# Patient Record
Sex: Male | Born: 1945 | Race: White | Hispanic: No | Marital: Married
Health system: Southern US, Community
[De-identification: ages and names within clinical notes are randomized; demographics above are authoritative.]

## PROBLEM LIST (undated history)

## (undated) DIAGNOSIS — I10 Essential (primary) hypertension: Secondary | ICD-10-CM

## (undated) HISTORY — PX: SPINAL CORD DECOMPRESSION: SHX97

---

## 1998-02-25 ENCOUNTER — Emergency Department (HOSPITAL_COMMUNITY): Admission: EM | Admit: 1998-02-25 | Discharge: 1998-02-25 | Payer: Self-pay | Admitting: Emergency Medicine

## 1998-04-08 ENCOUNTER — Emergency Department (HOSPITAL_COMMUNITY): Admission: EM | Admit: 1998-04-08 | Discharge: 1998-04-08 | Payer: Self-pay | Admitting: Emergency Medicine

## 1998-05-09 ENCOUNTER — Emergency Department (HOSPITAL_COMMUNITY): Admission: EM | Admit: 1998-05-09 | Discharge: 1998-05-09 | Payer: Self-pay | Admitting: Emergency Medicine

## 1998-07-05 ENCOUNTER — Encounter: Payer: Self-pay | Admitting: Neurological Surgery

## 1998-07-05 ENCOUNTER — Ambulatory Visit (HOSPITAL_COMMUNITY): Admission: RE | Admit: 1998-07-05 | Discharge: 1998-07-06 | Payer: Self-pay | Admitting: Neurological Surgery

## 1998-07-23 ENCOUNTER — Encounter: Payer: Self-pay | Admitting: Neurological Surgery

## 1998-07-23 ENCOUNTER — Inpatient Hospital Stay (HOSPITAL_COMMUNITY): Admission: RE | Admit: 1998-07-23 | Discharge: 1998-07-24 | Payer: Self-pay | Admitting: Neurological Surgery

## 2001-07-20 ENCOUNTER — Encounter: Payer: Self-pay | Admitting: Neurological Surgery

## 2001-07-20 ENCOUNTER — Encounter: Admission: RE | Admit: 2001-07-20 | Discharge: 2001-07-20 | Payer: Self-pay | Admitting: Neurological Surgery

## 2004-02-05 ENCOUNTER — Ambulatory Visit (HOSPITAL_COMMUNITY): Admission: RE | Admit: 2004-02-05 | Discharge: 2004-02-05 | Payer: Self-pay | Admitting: Neurological Surgery

## 2006-02-25 ENCOUNTER — Emergency Department (HOSPITAL_COMMUNITY): Admission: EM | Admit: 2006-02-25 | Discharge: 2006-02-26 | Payer: Self-pay | Admitting: Emergency Medicine

## 2006-05-14 ENCOUNTER — Emergency Department (HOSPITAL_COMMUNITY): Admission: EM | Admit: 2006-05-14 | Discharge: 2006-05-15 | Payer: Self-pay | Admitting: Emergency Medicine

## 2008-01-22 ENCOUNTER — Emergency Department (HOSPITAL_COMMUNITY): Admission: EM | Admit: 2008-01-22 | Discharge: 2008-01-23 | Payer: Self-pay | Admitting: Emergency Medicine

## 2011-08-03 LAB — CBC
Hemoglobin: 12.6 — ABNORMAL LOW
MCHC: 33.9
MCV: 88.2
Platelets: 81 — ABNORMAL LOW
RBC: 4.21 — ABNORMAL LOW
RDW: 15.1
WBC: 4.7

## 2011-08-03 LAB — URINALYSIS, ROUTINE W REFLEX MICROSCOPIC
Bilirubin Urine: NEGATIVE
Glucose, UA: NEGATIVE
Hgb urine dipstick: NEGATIVE
Urobilinogen, UA: 1
pH: 6.5

## 2011-08-03 LAB — BASIC METABOLIC PANEL
GFR calc non Af Amer: >60
Glucose, Bld: 105 — ABNORMAL HIGH

## 2012-08-07 DIAGNOSIS — R55 Syncope and collapse: Secondary | ICD-10-CM

## 2015-04-05 ENCOUNTER — Emergency Department (HOSPITAL_COMMUNITY): Payer: Medicare Other

## 2015-04-05 ENCOUNTER — Inpatient Hospital Stay (HOSPITAL_COMMUNITY)
Admission: EM | Admit: 2015-04-05 | Discharge: 2015-04-11 | DRG: 433 | Disposition: A | Discharge status: Home / Self Care | Payer: Medicare Other | Attending: Internal Medicine | Admitting: Internal Medicine

## 2015-04-05 ENCOUNTER — Encounter (HOSPITAL_COMMUNITY): Payer: Self-pay | Admitting: Emergency Medicine

## 2015-04-05 DIAGNOSIS — R601 Generalized edema: Secondary | ICD-10-CM | POA: Diagnosis not present

## 2015-04-05 DIAGNOSIS — D509 Iron deficiency anemia, unspecified: Secondary | ICD-10-CM | POA: Diagnosis present

## 2015-04-05 DIAGNOSIS — N179 Acute kidney failure, unspecified: Secondary | ICD-10-CM | POA: Diagnosis not present

## 2015-04-05 DIAGNOSIS — I619 Nontraumatic intracerebral hemorrhage, unspecified: Secondary | ICD-10-CM | POA: Diagnosis present

## 2015-04-05 DIAGNOSIS — Z880 Allergy status to penicillin: Secondary | ICD-10-CM

## 2015-04-05 DIAGNOSIS — I482 Chronic atrial fibrillation, unspecified: Secondary | ICD-10-CM | POA: Diagnosis present

## 2015-04-05 DIAGNOSIS — L0390 Cellulitis, unspecified: Secondary | ICD-10-CM | POA: Diagnosis present

## 2015-04-05 DIAGNOSIS — I890 Lymphedema, not elsewhere classified: Secondary | ICD-10-CM

## 2015-04-05 DIAGNOSIS — I10 Essential (primary) hypertension: Secondary | ICD-10-CM | POA: Diagnosis present

## 2015-04-05 DIAGNOSIS — I509 Heart failure, unspecified: Secondary | ICD-10-CM

## 2015-04-05 DIAGNOSIS — K7460 Unspecified cirrhosis of liver: Secondary | ICD-10-CM | POA: Diagnosis not present

## 2015-04-05 DIAGNOSIS — K7581 Nonalcoholic steatohepatitis (NASH): Secondary | ICD-10-CM | POA: Diagnosis present

## 2015-04-05 DIAGNOSIS — L03116 Cellulitis of left lower limb: Secondary | ICD-10-CM | POA: Diagnosis present

## 2015-04-05 DIAGNOSIS — E8770 Fluid overload, unspecified: Secondary | ICD-10-CM | POA: Diagnosis present

## 2015-04-05 DIAGNOSIS — R188 Other ascites: Secondary | ICD-10-CM | POA: Insufficient documentation

## 2015-04-05 DIAGNOSIS — T502X5A Adverse effect of carbonic-anhydrase inhibitors, benzothiadiazides and other diuretics, initial encounter: Secondary | ICD-10-CM | POA: Diagnosis not present

## 2015-04-05 DIAGNOSIS — I872 Venous insufficiency (chronic) (peripheral): Secondary | ICD-10-CM | POA: Diagnosis present

## 2015-04-05 DIAGNOSIS — N492 Inflammatory disorders of scrotum: Secondary | ICD-10-CM | POA: Diagnosis present

## 2015-04-05 DIAGNOSIS — N508 Other specified disorders of male genital organs: Secondary | ICD-10-CM | POA: Diagnosis present

## 2015-04-05 HISTORY — DX: Essential (primary) hypertension: I10

## 2015-04-05 LAB — CBC WITH DIFFERENTIAL/PLATELET
BASOS ABS: 0 10*3/uL (ref 0.0–0.1)
Basophils Relative: 1 % (ref 0–1)
Eosinophils Absolute: 0 10*3/uL (ref 0.0–0.7)
Eosinophils Relative: 1 % (ref 0–5)
HCT: 28.2 % — ABNORMAL LOW (ref 39.0–52.0)
HEMOGLOBIN: 8.5 g/dL — AB (ref 13.0–17.0)
Lymphocytes Relative: 4 % — ABNORMAL LOW (ref 12–46)
Lymphs Abs: 0.2 10*3/uL — ABNORMAL LOW (ref 0.7–4.0)
MCH: 23.5 pg — ABNORMAL LOW (ref 26.0–34.0)
MCHC: 30.1 g/dL (ref 30.0–36.0)
MCV: 77.9 fL — ABNORMAL LOW (ref 78.0–100.0)
MONO ABS: 0.3 10*3/uL (ref 0.1–1.0)
Monocytes Relative: 6 % (ref 3–12)
NEUTROS ABS: 4.6 10*3/uL (ref 1.7–7.7)
Neutrophils Relative %: 88 % — ABNORMAL HIGH (ref 43–77)
Platelets: 114 10*3/uL — ABNORMAL LOW (ref 150–400)
RBC: 3.62 MIL/uL — AB (ref 4.22–5.81)
RDW: 19.1 % — ABNORMAL HIGH (ref 11.5–15.5)
WBC: 5.2 10*3/uL (ref 4.0–10.5)

## 2015-04-05 LAB — I-STAT TROPONIN, ED: TROPONIN I, POC: 0.01 ng/mL (ref 0.00–0.08)

## 2015-04-05 LAB — COMPREHENSIVE METABOLIC PANEL
ALBUMIN: 2 g/dL — AB (ref 3.5–5.0)
ALT: 14 U/L — AB (ref 17–63)
ANION GAP: 8 (ref 5–15)
AST: 26 U/L (ref 15–41)
Alkaline Phosphatase: 96 U/L (ref 38–126)
BILIRUBIN TOTAL: 1.1 mg/dL (ref 0.3–1.2)
BUN: 19 mg/dL (ref 6–20)
CALCIUM: 7.7 mg/dL — AB (ref 8.9–10.3)
CO2: 27 mmol/L (ref 22–32)
Chloride: 102 mmol/L (ref 101–111)
Creatinine, Ser: 0.88 mg/dL (ref 0.61–1.24)
GFR calc non Af Amer: >60 mL/min (ref >60)
Glucose, Bld: 119 mg/dL — ABNORMAL HIGH (ref 65–99)
Potassium: 3.4 mmol/L — ABNORMAL LOW (ref 3.5–5.1)
Sodium: 137 mmol/L (ref 135–145)
TOTAL PROTEIN: 7 g/dL (ref 6.5–8.1)

## 2015-04-05 LAB — BRAIN NATRIURETIC PEPTIDE: B NATRIURETIC PEPTIDE 5: 189.9 pg/mL — AB (ref 0.0–100.0)

## 2015-04-05 MED ORDER — IOHEXOL 300 MG/ML  SOLN
100.0000 mL | Freq: Once | INTRAMUSCULAR | Status: AC | PRN
  Administered 2015-04-05: 100 mL via INTRAVENOUS

## 2015-04-05 MED ORDER — IOHEXOL 300 MG/ML  SOLN
25.0000 mL | Freq: Once | INTRAMUSCULAR | Status: AC | PRN
  Administered 2015-04-05: 25 mL via ORAL

## 2015-04-05 NOTE — ED Notes (Signed)
Patient is from Home states he is having Testicle swelling. Patient also have some swelling to the lower extermites with a rash noted. Patient alert and & O on arrival no pain on arrvial. Patient was given 10mg  of morphine by EMS.

## 2015-04-05 NOTE — ED Notes (Signed)
Patient has some reddnes noted to the LT inner thigh. Patient also has a rash to navel.

## 2015-04-05 NOTE — ED Provider Notes (Signed)
CSN: [id number]     Arrival date & time 04/05/15  2054 History   First MD Initiated Contact with Patient 04/05/15 2113     Chief Complaint  Patient presents with  . Groin Swelling     (Consider location/radiation/quality/duration/timing/severity/associated sxs/prior Treatment) HPI Comments: Patient is a 69 year old male with history of hypertension and multiple spinal surgeries. He presents today for evaluation of swelling and pain of his testicles and penis which has worsened over the past week. He is having difficulty urinating due to the discomfort. He denies any fevers or chills. He denies any injury or trauma. He reports he was seen at [facility] approximately one month ago. Had a CT scan performed which he was told was unremarkable and was discharged to home.  The history is provided by the patient.    Past Medical History  Diagnosis Date  . Hypertension    Past Surgical History  Procedure Laterality Date  . Spinal cord decompression     No family history on file. History  Substance Use Topics  . Smoking status: Passive Smoke Exposure - Never Smoker  . Smokeless tobacco: Not on file  . Alcohol Use: No    Review of Systems  All other systems reviewed and are negative.     Allergies  Penicillins  Home Medications   Prior to Admission medications   Not on File   BP 114/59 mmHg  Pulse 110  Temp(Src) 97.8 F (36.6 C) (Oral)  Resp 119  SpO2 98% Physical Exam  Constitutional: He is oriented to person, place, and time. No distress.  Patient is a 69 year old male who appears older than stated age. He appears chronically ill and is somewhat unkempt.  HENT:  Head: Normocephalic and atraumatic.  Neck: Normal range of motion. Neck supple.  Cardiovascular: Normal rate, regular rhythm and normal heart sounds.   No murmur heard. Pulmonary/Chest: Effort normal and breath sounds normal. No respiratory distress. He has no wheezes.  Abdominal: Soft. Bowel sounds are  normal. He exhibits distension. There is no tenderness. There is no rebound.  Genitourinary:  There is market swelling of the scrotum and penis. There is mild erythema but no significant warmth or crepitus.  Neurological: He is alert and oriented to person, place, and time.  Skin: Skin is warm and dry. He is not diaphoretic.  There is a erythematous area to the medial aspect of the left thigh that is warm to the touch. It measures approximately 8 x 15 cm.  Nursing note and vitals reviewed.   ED Course  Procedures (including critical care time) Labs Review Labs Reviewed  COMPREHENSIVE METABOLIC PANEL  CBC WITH DIFFERENTIAL/PLATELET  URINALYSIS, ROUTINE W REFLEX MICROSCOPIC (NOT AT [facility])    Imaging Review No results found.   EKG Interpretation   Date/Time:  Friday Apr 05 2015 21:14:12 EDT Ventricular Rate:  116 PR Interval:    QRS Duration: 90 QT Interval:  350 QTC Calculation: 486 R Axis:   -16 Text Interpretation:  Atrial fibrillation Borderline left axis deviation  Borderline repolarization abnormality Borderline prolonged QT interval  Confirmed by Kateryn Marasigan  MD, Denni France ([id number]) on 04/05/2015 11:24:26 PM      MDM   Final diagnoses:  None    Patient with history of afib, LE edema.  He presents with complaints of increased swelling, weakness, and shortness of breath which has worsened over the past 2 months, especially over the past week. He reports significant swelling of his penis and testicles and abdominal wall. His workup  reveals an elevated BNP and chest x-ray which reveals vascular congestion and cardiomegaly. A CT of the abdomen reveals soft tissue edema along the abdominal wall, ascites within the abdominal cavity, and edema of the scrotum but no gas.  He was given IV Lasix. As he is having difficulty ambulating and is having increased shortness of breath, I feel as though he will require admission. I've spoken with Dr. Onalee Huaavid from the hospitalist service who will  evaluate patient in the ER.    Geoffery Lyonsouglas Kalan Rinn, MD 04/06/15 [phone]

## 2015-04-05 NOTE — ED Notes (Signed)
Pt is aware urine is needed, pt is unable to urinate at this time. Urinal at bedside.  

## 2015-04-06 ENCOUNTER — Encounter (HOSPITAL_COMMUNITY): Payer: Self-pay | Admitting: *Deleted

## 2015-04-06 DIAGNOSIS — I482 Chronic atrial fibrillation, unspecified: Secondary | ICD-10-CM | POA: Diagnosis present

## 2015-04-06 DIAGNOSIS — T502X5A Adverse effect of carbonic-anhydrase inhibitors, benzothiadiazides and other diuretics, initial encounter: Secondary | ICD-10-CM | POA: Diagnosis not present

## 2015-04-06 DIAGNOSIS — I10 Essential (primary) hypertension: Secondary | ICD-10-CM | POA: Diagnosis present

## 2015-04-06 DIAGNOSIS — N508 Other specified disorders of male genital organs: Secondary | ICD-10-CM | POA: Diagnosis present

## 2015-04-06 DIAGNOSIS — R601 Generalized edema: Secondary | ICD-10-CM | POA: Diagnosis present

## 2015-04-06 DIAGNOSIS — I509 Heart failure, unspecified: Secondary | ICD-10-CM | POA: Diagnosis not present

## 2015-04-06 DIAGNOSIS — D509 Iron deficiency anemia, unspecified: Secondary | ICD-10-CM | POA: Diagnosis present

## 2015-04-06 DIAGNOSIS — I890 Lymphedema, not elsewhere classified: Secondary | ICD-10-CM

## 2015-04-06 DIAGNOSIS — Z880 Allergy status to penicillin: Secondary | ICD-10-CM | POA: Diagnosis not present

## 2015-04-06 DIAGNOSIS — N492 Inflammatory disorders of scrotum: Secondary | ICD-10-CM | POA: Diagnosis present

## 2015-04-06 DIAGNOSIS — K7581 Nonalcoholic steatohepatitis (NASH): Secondary | ICD-10-CM | POA: Diagnosis present

## 2015-04-06 DIAGNOSIS — R180 Malignant ascites: Secondary | ICD-10-CM | POA: Diagnosis not present

## 2015-04-06 DIAGNOSIS — L03116 Cellulitis of left lower limb: Secondary | ICD-10-CM | POA: Diagnosis present

## 2015-04-06 DIAGNOSIS — I872 Venous insufficiency (chronic) (peripheral): Secondary | ICD-10-CM | POA: Diagnosis present

## 2015-04-06 DIAGNOSIS — L0390 Cellulitis, unspecified: Secondary | ICD-10-CM | POA: Diagnosis not present

## 2015-04-06 DIAGNOSIS — L03119 Cellulitis of unspecified part of limb: Secondary | ICD-10-CM

## 2015-04-06 DIAGNOSIS — N179 Acute kidney failure, unspecified: Secondary | ICD-10-CM | POA: Diagnosis not present

## 2015-04-06 DIAGNOSIS — E8770 Fluid overload, unspecified: Secondary | ICD-10-CM | POA: Diagnosis present

## 2015-04-06 DIAGNOSIS — K7460 Unspecified cirrhosis of liver: Secondary | ICD-10-CM | POA: Diagnosis present

## 2015-04-06 DIAGNOSIS — R188 Other ascites: Secondary | ICD-10-CM | POA: Diagnosis present

## 2015-04-06 DIAGNOSIS — S06369S Traumatic hemorrhage of cerebrum, unspecified, with loss of consciousness of unspecified duration, sequela: Secondary | ICD-10-CM

## 2015-04-06 DIAGNOSIS — I619 Nontraumatic intracerebral hemorrhage, unspecified: Secondary | ICD-10-CM | POA: Diagnosis present

## 2015-04-06 LAB — URINALYSIS, ROUTINE W REFLEX MICROSCOPIC
GLUCOSE, UA: NEGATIVE mg/dL
HGB URINE DIPSTICK: NEGATIVE
Ketones, ur: NEGATIVE mg/dL
NITRITE: NEGATIVE
PROTEIN: NEGATIVE mg/dL
Specific Gravity, Urine: 1.037 — ABNORMAL HIGH (ref 1.005–1.030)
UROBILINOGEN UA: 1 mg/dL (ref 0.0–1.0)
pH: 5 (ref 5.0–8.0)

## 2015-04-06 LAB — RETICULOCYTES
RBC.: 3.9 MIL/uL — ABNORMAL LOW (ref 4.22–5.81)
RETIC COUNT ABSOLUTE: 74.1 10*3/uL (ref 19.0–186.0)
RETIC CT PCT: 1.9 % (ref 0.4–3.1)

## 2015-04-06 LAB — CBC
HEMATOCRIT: 30.3 % — AB (ref 39.0–52.0)
HEMOGLOBIN: 9.2 g/dL — AB (ref 13.0–17.0)
MCH: 23.6 pg — AB (ref 26.0–34.0)
MCHC: 30.4 g/dL (ref 30.0–36.0)
MCV: 77.7 fL — ABNORMAL LOW (ref 78.0–100.0)
Platelets: 122 10*3/uL — ABNORMAL LOW (ref 150–400)
RBC: 3.9 MIL/uL — ABNORMAL LOW (ref 4.22–5.81)
RDW: 19.4 % — ABNORMAL HIGH (ref 11.5–15.5)
WBC: 5.9 10*3/uL (ref 4.0–10.5)

## 2015-04-06 LAB — URINE MICROSCOPIC-ADD ON

## 2015-04-06 LAB — BASIC METABOLIC PANEL
Anion gap: 8 (ref 5–15)
BUN: 18 mg/dL (ref 6–20)
CALCIUM: 7.9 mg/dL — AB (ref 8.9–10.3)
CHLORIDE: 99 mmol/L — AB (ref 101–111)
CO2: 28 mmol/L (ref 22–32)
Creatinine, Ser: 0.85 mg/dL (ref 0.61–1.24)
GFR calc Af Amer: >60 mL/min (ref >60)
GLUCOSE: 113 mg/dL — AB (ref 65–99)
POTASSIUM: 3.2 mmol/L — AB (ref 3.5–5.1)
SODIUM: 135 mmol/L (ref 135–145)

## 2015-04-06 LAB — IRON AND TIBC
Iron: 34 ug/dL — ABNORMAL LOW (ref 45–182)
SATURATION RATIOS: 11 % — AB (ref 17.9–39.5)
TIBC: 312 ug/dL (ref 250–450)
UIBC: 278 ug/dL

## 2015-04-06 LAB — VITAMIN B12: VITAMIN B 12: 402 pg/mL (ref 180–914)

## 2015-04-06 LAB — TSH: TSH: 2.392 u[IU]/mL (ref 0.350–4.500)

## 2015-04-06 LAB — FOLATE: Folate: 8.5 ng/mL (ref >5.9)

## 2015-04-06 LAB — FERRITIN: FERRITIN: 52 ng/mL (ref 24–336)

## 2015-04-06 MED ORDER — ALPRAZOLAM 0.5 MG PO TABS
0.5000 mg | ORAL_TABLET | Freq: Two times a day (BID) | ORAL | Status: DC | PRN
  Administered 2015-04-09 – 2015-04-11 (×5): 0.5 mg via ORAL
  Filled 2015-04-06 (×5): qty 1

## 2015-04-06 MED ORDER — ONDANSETRON HCL 4 MG/2ML IJ SOLN: 4.0000 mg | Freq: Four times a day (QID) | INTRAMUSCULAR | Status: DC | PRN

## 2015-04-06 MED ORDER — DILTIAZEM HCL 25 MG/5ML IV SOLN
5.0000 mg | Freq: Once | INTRAVENOUS | Status: AC
  Administered 2015-04-06: 5 mg via INTRAVENOUS
  Filled 2015-04-06 (×2): qty 5

## 2015-04-06 MED ORDER — OXYCODONE-ACETAMINOPHEN 5-325 MG PO TABS
1.0000 | ORAL_TABLET | ORAL | Status: DC | PRN
  Administered 2015-04-06 – 2015-04-09 (×18): 1 via ORAL
  Filled 2015-04-06 (×18): qty 1

## 2015-04-06 MED ORDER — SPIRONOLACTONE 100 MG PO TABS
100.0000 mg | ORAL_TABLET | Freq: Every day | ORAL | Status: DC
  Administered 2015-04-06 – 2015-04-11 (×6): 100 mg via ORAL
  Filled 2015-04-06 (×6): qty 1

## 2015-04-06 MED ORDER — SODIUM CHLORIDE 0.9 % IV SOLN: 250.0000 mL | INTRAVENOUS | Status: DC | PRN

## 2015-04-06 MED ORDER — SODIUM CHLORIDE 0.9 % IJ SOLN: 3.0000 mL | INTRAMUSCULAR | Status: DC | PRN

## 2015-04-06 MED ORDER — FUROSEMIDE 40 MG PO TABS
40.0000 mg | ORAL_TABLET | Freq: Every day | ORAL | Status: DC
  Administered 2015-04-06 – 2015-04-07 (×2): 40 mg via ORAL
  Filled 2015-04-06 (×3): qty 1

## 2015-04-06 MED ORDER — VANCOMYCIN HCL 10 G IV SOLR
1250.0000 mg | Freq: Two times a day (BID) | INTRAVENOUS | Status: DC
  Administered 2015-04-06 – 2015-04-09 (×5): 1250 mg via INTRAVENOUS
  Filled 2015-04-06 (×5): qty 1250

## 2015-04-06 MED ORDER — FUROSEMIDE 10 MG/ML IJ SOLN: 40.0000 mg | Freq: Once | INTRAMUSCULAR | Status: DC

## 2015-04-06 MED ORDER — ENOXAPARIN SODIUM 40 MG/0.4ML ~~LOC~~ SOLN
40.0000 mg | Freq: Every day | SUBCUTANEOUS | Status: DC
  Administered 2015-04-06 – 2015-04-11 (×6): 40 mg via SUBCUTANEOUS
  Filled 2015-04-06 (×6): qty 0.4

## 2015-04-06 MED ORDER — PROPRANOLOL HCL ER 60 MG PO CP24
60.0000 mg | ORAL_CAPSULE | Freq: Every day | ORAL | Status: DC
  Administered 2015-04-06 – 2015-04-11 (×6): 60 mg via ORAL
  Filled 2015-04-06 (×7): qty 1

## 2015-04-06 MED ORDER — SODIUM CHLORIDE 0.9 % IJ SOLN
3.0000 mL | Freq: Two times a day (BID) | INTRAMUSCULAR | Status: DC
  Administered 2015-04-06 – 2015-04-11 (×9): 3 mL via INTRAVENOUS

## 2015-04-06 MED ORDER — POTASSIUM CHLORIDE 10 MEQ/100ML IV SOLN
10.0000 meq | INTRAVENOUS | Status: AC
  Administered 2015-04-06 (×2): 10 meq via INTRAVENOUS
  Filled 2015-04-06 (×2): qty 100

## 2015-04-06 MED ORDER — POTASSIUM CHLORIDE CRYS ER 20 MEQ PO TBCR
40.0000 meq | EXTENDED_RELEASE_TABLET | Freq: Every day | ORAL | Status: DC
  Administered 2015-04-06 – 2015-04-11 (×6): 40 meq via ORAL
  Filled 2015-04-06 (×6): qty 2

## 2015-04-06 MED ORDER — FUROSEMIDE 10 MG/ML IJ SOLN
40.0000 mg | Freq: Two times a day (BID) | INTRAMUSCULAR | Status: DC
  Administered 2015-04-06: 40 mg via INTRAVENOUS
  Filled 2015-04-06 (×3): qty 4

## 2015-04-06 MED ORDER — VANCOMYCIN HCL 10 G IV SOLR
2000.0000 mg | Freq: Once | INTRAVENOUS | Status: AC
  Administered 2015-04-06: 2000 mg via INTRAVENOUS
  Filled 2015-04-06: qty 2000

## 2015-04-06 MED ORDER — FUROSEMIDE 10 MG/ML IJ SOLN
80.0000 mg | Freq: Once | INTRAMUSCULAR | Status: AC
  Administered 2015-04-06: 80 mg via INTRAVENOUS
  Filled 2015-04-06: qty 8

## 2015-04-06 MED ORDER — SODIUM CHLORIDE 0.9 % IJ SOLN
3.0000 mL | Freq: Two times a day (BID) | INTRAMUSCULAR | Status: DC
  Administered 2015-04-06 – 2015-04-10 (×6): 3 mL via INTRAVENOUS

## 2015-04-06 NOTE — H&P (Signed)
PCP:   No primary care provider on file.   Chief Complaint:  swelling  HPI: 69 yo male with unclear history gets all of his care at [facility] comes in with his sons for not getting better and not happy with [facility] hospital.  Pt was hospitalized at [facility] approx 2 weeks ago and it sounds like he has cirrhosis of the liver and chronic lymphedema to his legs which is severe.  Comes in today because the swelling is worse, his scrotum is swollen along with his pannus now, but his legs are better.  He denies heart failure.  However, the patient and the sons really dont know much about what is going on and have a lot of questions about his [facility] stay.  We do not have records from [facility] yet.  Pt does know that he has been told he has cirrhosis, no etoh history.  Has not been evaluated by GI doctor.  Denies fevers.  Is always sob especially with exertion.  Is very weak.  His son is changing his leg dressings three times a week, and his sons say that his legs look much better than they did 2 weeks ago (of note, his legs still look horrible).  He has some new redness to his left inner upper thigh which is new.  He does not use oxygen at home.  He says he takes a fluid pill at home.  He does not know what his home meds are.  We are asked to admit patient for anasarca, gross fluid overload and cellulitis of his left inner thigh.  History obtained from patient and two sons.  Of note, after multiple times asking patient whether he was offered rehab or snf placement from [facility] (pt obviously would meet criteria for rehab) it finally came out that they did offer him this but he refused so he was sent home.  Review of Systems:  Positive and negative as per HPI otherwise all other systems are negative  Past Medical History: Past Medical History  Diagnosis Date  . Hypertension    Past Surgical History  Procedure Laterality Date  . Spinal cord decompression      Medications: Prior to Admission  medications   Medication Sig Start Date End Date Taking? Authorizing Provider  acetaminophen (TYLENOL) 500 MG tablet Take 1,000 mg by mouth every 6 (six) hours as needed for mild pain.   Yes Historical Provider, MD  ALPRAZolam Prudy Feeler) 0.5 MG tablet Take 0.5 mg by mouth 2 (two) times daily as needed for anxiety.   Yes Historical Provider, MD  oxyCODONE-acetaminophen (PERCOCET/ROXICET) 5-325 MG per tablet Take 1 tablet by mouth every 4 (four) hours as needed for severe pain.   Yes Historical Provider, MD    Allergies:   Allergies  Allergen Reactions  . Penicillins Rash    Social History:  reports that he has been passively smoking.  He does not have any smokeless tobacco history on file. He reports that he does not drink alcohol or use illicit drugs.  Family History: Neg for cirrhosis  Physical Exam: Filed Vitals:   04/05/15 2230 04/06/15 0030 04/06/15 0045 04/06/15 0115  BP: 130/69 126/75 140/53 122/66  Pulse: 102 107 104 121  Temp:      TempSrc:      Resp:      SpO2: 98% 98% 96% 94%   General appearance: alert, cooperative and no distress Head: Normocephalic, without obvious abnormality, atraumatic Eyes: negative Nose: Nares normal. Septum midline. Mucosa normal. No drainage  or sinus tenderness. Neck: no JVD and supple, symmetrical, trachea midline Lungs: clear to auscultation bilaterally Heart: regular rate and rhythm, S1, S2 normal, no murmur, click, rub or gallop Abdomen: soft, non-tender; bowel sounds normal; no masses,  no organomegaly  Pannus with edema Extremities: edema 4+ edema up to scrotum, scrotum swollen Pulses: 2+ and symmetric Skin: severe lymphedema to ble with skin breakdown throughout.  Cellulitis to inner upper left thigh warm to touch Neurologic: Grossly normal    Labs on Admission:   Recent Labs  04/05/15 2224  NA 137  K 3.4*  CL 102  CO2 27  GLUCOSE 119*  BUN 19  CREATININE 0.88  CALCIUM 7.7*    Recent Labs  04/05/15 2224  AST 26   ALT 14*  ALKPHOS 96  BILITOT 1.1  PROT 7.0  ALBUMIN 2.0*     Recent Labs  04/05/15 2224  WBC 5.2  NEUTROABS 4.6  HGB 8.5*  HCT 28.2*  MCV 77.9*  PLT 114*   Radiological Exams on Admission: Dg Chest 2 View  04/05/2015   CLINICAL DATA:  Lower extremity and groin swelling, acute onset. Initial encounter.  EXAM: CHEST  2 VIEW  COMPARISON:  Chest radiograph performed 03/05/2015  FINDINGS: The lungs are well-aerated. Vascular congestion is noted, with mildly increased interstitial markings, concerning for mild interstitial edema. A small right pleural effusion is noted. No pneumothorax is seen.  The heart is enlarged.  No acute osseous abnormalities are seen.  IMPRESSION: Vascular congestion and cardiomegaly, with mildly increased interstitial markings, concerning for mild interstitial edema. Small right pleural effusion noted.   Electronically Signed   By: Roanna RaiderJeffery  Chang M.D.   On: 04/05/2015 23:06   Ct Abdomen Pelvis W Contrast  04/06/2015   CLINICAL DATA:  Status post fall 3 months ago. Scrotal swelling and lower extremity swelling, chronic in nature. Initial encounter.  EXAM: CT ABDOMEN AND PELVIS WITH CONTRAST  TECHNIQUE: Multidetector CT imaging of the abdomen and pelvis was performed using the standard protocol following bolus administration of intravenous contrast.  CONTRAST:  100mL OMNIPAQUE IOHEXOL 300 MG/ML  SOLN  COMPARISON:  CT of the abdomen and pelvis from 07/05/2014, and abdominal ultrasound performed 03/07/2015  FINDINGS: The visualized lung bases are clear. Scattered coronary artery calcifications are seen.  Moderate ascites is noted within the abdomen and pelvis, tracking into a small to moderate umbilical hernia. Diffuse soft tissue edema is noted along the lateral abdominal wall and pannus, likely reflecting mild anasarca.  Diffuse nodularity of the hepatic contour is compatible with hepatic cirrhosis. The spleen is enlarged, measuring 20.6 cm in length. Stones are seen  dependently within the gallbladder. The gallbladder is otherwise unremarkable. The pancreas and adrenal glands are unremarkable.  A 4 mm nonobstructing stone is noted at the interpole region of the right kidney. The kidneys are otherwise unremarkable. There is no evidence of hydronephrosis. No obstructing ureteral stones are seen. No significant perinephric stranding is appreciated.  No free fluid is identified. The small bowel is unremarkable in appearance. The stomach is within normal limits. No acute vascular abnormalities are seen. Mild calcification is seen along the abdominal aorta and its branches.  The appendix is normal in caliber and contains air, without evidence of appendicitis. Mild diverticulosis is noted along the sigmoid colon, without evidence of diverticulitis.  The bladder is mildly distended and grossly unremarkable. The prostate is not well characterized. A small to moderate right inguinal hernia is seen, partially filled with fluid. There is diffuse soft  tissue edema at the level of the scrotum. No underlying soft tissue air is seen. No inguinal lymphadenopathy is seen.  No acute osseous abnormalities are identified. Anterior flowing osteophytes are seen along the thoracic and lumbar spine, with fusion along the posterior elements, likely reflecting ankylosing spondylitis. There is also fusion at the sacroiliac joints.  IMPRESSION: 1. Diffuse scrotal wall edema noted. Small to moderate right inguinal hernia is partially filled with fluid. No evidence of herniation of bowel. 2. Moderate ascites within the abdomen and pelvis, tracking into a small to moderate umbilical hernia. 3. Diffuse soft tissue edema along the lateral abdominal wall and pannus likely reflects mild anasarca. 4. Findings of hepatic cirrhosis.  Splenomegaly noted. 5. Cholelithiasis; gallbladder otherwise unremarkable. 6. Scattered coronary artery calcification noted. 7. Mild calcification along the abdominal aorta and its  branches. 8. Mild diverticulosis along the sigmoid colon, without evidence of diverticulitis. 9. Changes of ankylosing spondylitis noted.   Electronically Signed   By: Roanna Raider M.D.   On: 04/06/2015 00:30   ekg and cxr reviewed by myself  Assessment/Plan  69 yo male with anasarca with report of cirrhosis of the liver  Principal Problem:   Cirrhosis of liver with ascites- decompensated. Received lasix  iv once in ed will cont at  iv q 12 hours and see how he diuresis with that.   Unknown what he takes at home.  Med rec pending pharmacy review.  Check coags.  o2 sats are good, vss.  Pt appears grossly fluid overloaded and with advanced liver disease.  Obtain records from [facility] to see what work up was done 2 weeks ago before repeating it all now.  Active Problems:   Anasarca-  Noted, as above   Cellulitis-  Place on iv vancomycin for his cellulitis to inner upper left thigh   Lymphedema in setting of cirrhosis - per family his legs are much improved.  Elevate legs.  Obtain wound care in am.  Unable to place pictures of legs in chart myself, consider doing this tomorrow.   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  Admit to tele bed.  FULL CODE.  Eban Weick A 04/06/2015, 2:18 AM

## 2015-04-06 NOTE — Evaluation (Signed)
Physical Therapy Evaluation Patient Details Name: Isaac Ashley MRN: [id number] DOB: 12/14/1945 Today's Date: 04/06/2015   History of Present Illness  Patient is a 69 yo male admitted 04/05/15 with swollen scrotum and LE's.  Patient with gross fluid overload and chronic lymphedema (severe) per chart.  PMH: HTN, chronic lymphedema, back surgery, Afib, ICH from fall - surgery  Clinical Impression  Patient presents with problems listed below.  Will benefit from acute PT to maximize functional mobility prior to discharge.  Recommend SNF at discharge for continued therapy.    Follow Up Recommendations SNF;Supervision/Assistance - 24 hour    Equipment Recommendations  None recommended by PT    Recommendations for Other Services       Precautions / Restrictions Precautions Precautions: Fall Precaution Comments: Patient has fallen x3 recently per patient Restrictions Weight Bearing Restrictions: No      Mobility  Bed Mobility Overal bed mobility: Needs Assistance Bed Mobility: Rolling;Supine to Sit;Sit to Supine Rolling: Min guard   Supine to sit: Min assist;HOB elevated Sit to supine: Min assist;HOB elevated   General bed mobility comments: Verbal cues for technique.  Required increased time for all mobility.  Use of bedrail to roll to both sides.  Assist to raise trunk to sitting position.  Once upright, patient with good sitting balance.  Patient sat EOB x 8 minutes - difficult due to scrotal edema.  Assist to bring LE's onto bed to return to supine.  Transfers                 General transfer comment: NT due to pain  Ambulation/Gait                Stairs            Wheelchair Mobility    Modified Rankin (Stroke Patients Only)       Balance Overall balance assessment: Needs assistance Sitting-balance support: No upper extremity supported;Feet supported Sitting balance-Leahy Scale: Fair                                        Pertinent Vitals/Pain Pain Assessment: 0-10 Pain Score: 9  Pain Location: Back, scrotum, LE's Pain Descriptors / Indicators: Aching;Sore;Throbbing Pain Intervention(s): Limited activity within patient's tolerance;Repositioned    Home Living Family/patient expects to be discharged to:: Private residence Living Arrangements: Spouse/significant other;Children (Daughter) Available Help at Discharge: Family;Available 24 hours/day (Son's also assist) Type of Home: House Home Access: Ramped entrance     Home Layout: One level Home Equipment: Walker - 2 wheels;Cane - single point;Bedside commode;Shower seat;Hospital bed;Wheelchair - manual      Prior Function Level of Independence: Needs assistance   Gait / Transfers Assistance Needed: Wife and children assist patient with ambulation with walker  ADL's / Homemaking Assistance Needed: Assist for bathing, dressing, meal prep.  Son's change LE dressings        Hand Dominance        Extremity/Trunk Assessment   Upper Extremity Assessment: Generalized weakness           Lower Extremity Assessment: RLE deficits/detail;LLE deficits/detail RLE Deficits / Details: Strength grossly 3/5.  Lower leg bandaged.  Significant edema LLE Deficits / Details: Strength grossly 3/5.  Lower leg bandaged.  Significant edema in upper and lower leg     Communication   Communication: Expressive difficulties (Difficult to understand at times)  Cognition Arousal/Alertness: Awake/alert Behavior During Therapy:  Flat affect Overall Cognitive Status: No family/caregiver present to determine baseline cognitive functioning                      General Comments General comments (skin integrity, edema, etc.): Poor skin integrity BLE's distally.  Edema BLE's    Exercises        Assessment/Plan    PT Assessment Patient needs continued PT services  PT Diagnosis Difficulty walking;Generalized weakness;Acute pain   PT Problem List  Decreased strength;Decreased activity tolerance;Decreased balance;Decreased mobility;Decreased knowledge of use of DME;Obesity;Decreased skin integrity;Pain  PT Treatment Interventions DME instruction;Gait training;Functional mobility training;Therapeutic activities;Balance training;Patient/family education   PT Goals (Current goals can be found in the Care Plan section) Acute Rehab PT Goals Patient Stated Goal: To go home PT Goal Formulation: With patient Time For Goal Achievement: 04/13/15 Potential to Achieve Goals: Fair    Frequency Min 3X/week   Barriers to discharge        Co-evaluation               End of Session   Activity Tolerance: Patient limited by pain;Patient limited by fatigue Patient left: in bed;with call bell/phone within reach           Time: 1919-1933 PT Time Calculation (min) (ACUTE ONLY): 14 min   Charges:   PT Evaluation $Initial PT Evaluation Tier I: 1 Procedure     PT G CodesVena Ashley:        Isaac Ashley, PT, [facility] Acute Rehab Services Pager [phone]

## 2015-04-06 NOTE — Progress Notes (Signed)
ANTIBIOTIC CONSULT NOTE - INITIAL  Pharmacy Consult for Vancomycin Indication: cellulitis  Allergies  Allergen Reactions  . Penicillins Rash    Patient Measurements: Height: 6\' 1"  (185.4 cm) Weight: 285 lb (129.275 kg) (pt stated weight) IBW/kg (Calculated) : 79.9 Adjusted Body Weight:   Vital Signs: Temp: 97.8 F (36.6 C) (05/27 2110) Temp Source: Oral (05/27 2110) BP: 122/66 mmHg (05/28 0115) Pulse Rate: 121 (05/28 0115) Intake/Output from previous day:   Intake/Output from this shift:    Labs:  Recent Labs  04/05/15 2224  WBC 5.2  HGB 8.5*  PLT 114*  CREATININE 0.88   CrCl cannot be calculated (Unknown ideal weight.). No results for input(s): VANCOTROUGH, VANCOPEAK, VANCORANDOM, GENTTROUGH, GENTPEAK, GENTRANDOM, TOBRATROUGH, TOBRAPEAK, TOBRARND, AMIKACINPEAK, AMIKACINTROU, AMIKACIN in the last 72 hours.   Microbiology: No results found for this or any previous visit (from the past 720 hour(s)).  Medical History: Past Medical History  Diagnosis Date  . Hypertension     Medications:  Prescriptions prior to admission  Medication Sig Dispense Refill Last Dose  . acetaminophen (TYLENOL) 500 MG tablet Take 1,000 mg by mouth every 6 (six) hours as needed for mild pain.   04/04/2015 at Unknown time  . ALPRAZolam (XANAX) 0.5 MG tablet Take 0.5 mg by mouth 2 (two) times daily as needed for anxiety.   04/04/2015 at Unknown time  . oxyCODONE-acetaminophen (PERCOCET/ROXICET) 5-325 MG per tablet Take 1 tablet by mouth every 4 (four) hours as needed for severe pain.   Past Month at Unknown time   Scheduled:  . enoxaparin (LOVENOX) injection  40 mg Subcutaneous Daily  . furosemide  40 mg Intravenous BID  . sodium chloride  3 mL Intravenous Q12H  . sodium chloride  3 mL Intravenous Q12H   Infusions:    Assessment: 69yo male with history of Afib and LE edema presents with increased swelling of groin, weakness, and SOB. Pharmacy is consulted to dose vancomycin for  cellulitis. Pt is afebrile, WBC 5.2, sCr 0.88.  Goal of Therapy:  Vancomycin trough level 10-15 mcg/ml  Plan:  Vancomycin 2g IV load followed by 1250mg  q12h Measure antibiotic drug levels at steady state Follow up culture results, renal function, and clinical course  Arlean HoppingCorey M. Newman PiesBall, PharmD Clinical Pharmacist Pager [phone] 04/06/2015,3:04 AM

## 2015-04-06 NOTE — Progress Notes (Signed)
Utilization review completed.  

## 2015-04-06 NOTE — Progress Notes (Signed)
4:11 PM I agree with HPI/GPe and A/P per Dr. Onalee Huaavid  69 y/o ? recent [facility] 4/28--?[facility] Acute LE cellulitis.  During that Hospital stay, he developed Afib c RVR and was started on lopressor BID.  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  He has had multiple back surgeries-I did not ask him sexual orientation or h/o sexual contact in prison He is unsure if he has ever had hepatitis  He was Rx there initially Vancomycin and Zosyn and sent home on Bactrim In either event-He was noticed to be swollen over the past 2-3 days and [facility] He was supposed to follow up post-Hospital stay with Dr. Olena [place]anaj [place]c 5/1      Grossly ascitic ++ shift dukll Erythema thigh +HJ reflex   Patient Active Problem List   Diagnosis Date Noted  . Anasarca 04/06/2015  . Cirrhosis of liver with ascites 04/06/2015  . Cellulitis 04/06/2015  . Lymphedema 04/06/2015  . Microcytic anemia 04/06/2015  . Chronic a-fib 04/06/2015  . 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 Congestive heart disease     Start aldactone and Lasix-[facility] + splenomegally Start propanolol 60 daily-For Varicosity bleed prevention etc etc NEeds OP Gi input NO need CIWA currently Cont Vanc 1200 cc fluid restrict Aldactone: Lasix 100:40 po LVP ordered-no need Check SBP--already abx on board Cannot reliably use coumadin for Afib despite ? [place] score-PLT 122 Replace Kdur 40 daily

## 2015-04-06 NOTE — ED Notes (Addendum)
This RN and Gabe, RN removed covering from legs on pt with Admitting present.  Dressings took approximately 20 minutes to remove.  Family has been assisting pt at home with dressing.  Towels and bandages and tape all removed.   Severe swelling, moisture and skin breakdown noted.  Skin is yellow, flaking, weepy.

## 2015-04-06 NOTE — ED Notes (Signed)
Report received from RN Janee on pt's swelling, skin condition and general assessment.

## 2015-04-07 ENCOUNTER — Inpatient Hospital Stay (HOSPITAL_COMMUNITY): Payer: Medicare Other

## 2015-04-07 LAB — CBC WITH DIFFERENTIAL/PLATELET
Basophils Absolute: 0.1 10*3/uL (ref 0.0–0.1)
Basophils Relative: 1 % (ref 0–1)
EOS ABS: 0.2 10*3/uL (ref 0.0–0.7)
Eosinophils Relative: 5 % (ref 0–5)
HCT: 29.3 % — ABNORMAL LOW (ref 39.0–52.0)
Hemoglobin: 8.9 g/dL — ABNORMAL LOW (ref 13.0–17.0)
Lymphocytes Relative: 17 % (ref 12–46)
Lymphs Abs: 0.7 10*3/uL (ref 0.7–4.0)
MCH: 24.2 pg — AB (ref 26.0–34.0)
MCHC: 30.4 g/dL (ref 30.0–36.0)
MCV: 79.6 fL (ref 78.0–100.0)
Monocytes Absolute: 0.8 10*3/uL (ref 0.1–1.0)
Monocytes Relative: 19 % — ABNORMAL HIGH (ref 3–12)
NEUTROS ABS: 2.4 10*3/uL (ref 1.7–7.7)
Neutrophils Relative %: 58 % (ref 43–77)
Platelets: 128 10*3/uL — ABNORMAL LOW (ref 150–400)
RBC: 3.68 MIL/uL — ABNORMAL LOW (ref 4.22–5.81)
RDW: 19.4 % — AB (ref 11.5–15.5)
WBC: 4 10*3/uL (ref 4.0–10.5)

## 2015-04-07 LAB — COMPREHENSIVE METABOLIC PANEL
ALT: 14 U/L — AB (ref 17–63)
AST: 28 U/L (ref 15–41)
Albumin: 1.9 g/dL — ABNORMAL LOW (ref 3.5–5.0)
Alkaline Phosphatase: 75 U/L (ref 38–126)
Anion gap: 4 — ABNORMAL LOW (ref 5–15)
BILIRUBIN TOTAL: 0.8 mg/dL (ref 0.3–1.2)
BUN: 26 mg/dL — ABNORMAL HIGH (ref 6–20)
CHLORIDE: 100 mmol/L — AB (ref 101–111)
CO2: 30 mmol/L (ref 22–32)
Calcium: 7.8 mg/dL — ABNORMAL LOW (ref 8.9–10.3)
Creatinine, Ser: 1.07 mg/dL (ref 0.61–1.24)
GFR calc Af Amer: >60 mL/min (ref >60)
GFR calc non Af Amer: >60 mL/min (ref >60)
Glucose, Bld: 135 mg/dL — ABNORMAL HIGH (ref 65–99)
POTASSIUM: 3.8 mmol/L (ref 3.5–5.1)
Sodium: 134 mmol/L — ABNORMAL LOW (ref 135–145)
TOTAL PROTEIN: 6.5 g/dL (ref 6.5–8.1)

## 2015-04-07 LAB — MAGNESIUM: Magnesium: 1.9 mg/dL (ref 1.7–2.4)

## 2015-04-07 LAB — URINE CULTURE

## 2015-04-07 LAB — PROTIME-INR
INR: 1.53 — ABNORMAL HIGH (ref 0.00–1.49)
Prothrombin Time: 18.5 seconds — ABNORMAL HIGH (ref 11.6–15.2)

## 2015-04-07 MED ORDER — LIDOCAINE HCL (PF) 1 % IJ SOLN
INTRAMUSCULAR | Status: AC
  Filled 2015-04-07: qty 10

## 2015-04-07 MED ORDER — ALBUMIN HUMAN 25 % IV SOLN
50.0000 g | Freq: Once | INTRAVENOUS | Status: AC
  Administered 2015-04-07: 50 g via INTRAVENOUS
  Filled 2015-04-07: qty 200

## 2015-04-07 NOTE — Progress Notes (Signed)
Isaac Ashley:[id number] DOB: 01/12/1946 DOA: 04/05/2015 PCP: [name], MD  Brief narrative:  69 y/o ? recent admission 4/28--?03/10/15 to [facility] c Acute LE cellulitis. During that Hospital stay, he developed Afib c RVR and was started on lopressor BID. He has a chronic Afib h/o and was previously controlled on Cardizem He has chronic lymphedema per report ~7-8 yrs duration. 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 He has had multiple back surgeries-I did not ask him sexual orientation or h/o sexual contact in prison He is unsure if he has ever had hepatitis He was Rx there initially Vancomycin and Zosyn and sent home on Bactrim. In either event-He was noticed to be swollen over the past 2-3 days and came to [facility] ed for evalutaion He was supposed to follow up post-Hospital stay with Dr. [name] in [place] on d/c  5/1  Past medical history-As per Problem list Chart reviewed as below- Reviewed and reviewed [facility] notes  Consultants:  Wound nurse   Procedures:Measurement: Numerous open areas on bilateral LEs, on the left, the two largest measure Mediall: 4.5cm x 3.5cm and Anterior: 3cm x 5cm. The depth of both is obscured by the presence of adherent yellow slough. On the right, the two largest measure Distal: 4cm x 3cm and Proximal 3cm x 4cm.  The depth of both is obscured by the presence of yellow slough. Wound bed:See above. Drainage (amount, consistency, odor) LEs have been weeping, albeit this is less since LEs have been elevated and patient is in bed. Periwound: Bilateral LEs with chronic changes due to moisture, numerous elevations, and depressions.  Toenails are long and hygiene is poor. Dressing procedure/placement/frequency: I will provide orders for topical conservative care and request Ortho Tech to come and place Unna's Boots bilaterally.  They will add to their schedule for twice weekly  changes while here.  Patient will require either [facility] or confirmation that the patient can get to the [facility] care center for maintenance. If you agree, Please order.   Antibiotics:  Iv vanc on admit 5/27   Subjective   Better Feels less swollen No cp no f chills sob nor other issues currently   Objective    Interim History:   Telemetry: fib   Objective: Filed Vitals:   04/07/15 0434 04/07/15 1125 04/07/15 1141 04/07/15 1338  BP: 109/64 114/71 116/66 108/61  Pulse: 73   85  Temp: 98.3 F (36.8 C)   97.2 F (36.2 C)  TempSrc: Oral   Oral  Resp: 18   18  Height:      Weight: 134.854 kg (297 lb 4.8 oz)     SpO2: 98%   95%    Intake/Output Summary (Last 24 hours) at 04/07/15 1616 Last data filed at 04/07/15 1339  Gross per 24 hour  Intake   1280 ml  Output    675 ml  Net    605 ml    Exam:  General: eomi, ncat Cardiovascular: s1 s2 no m/r/g Respiratory: clear no added soudn Abdomen:  Obese-less firm Skin wounds briefly examined-see above Neuro intact-no tremor nor asterixis  Data Reviewed: Basic Metabolic Panel:  Recent Labs Lab 04/05/15 2224 04/06/15 0346 04/07/15 0548 04/07/15 1230  NA 137 135 134*  --   K 3.4* 3.2* 3.8  --   CL 102 99* 100*  --   CO2 --   GLUCOSE 119* 113* 135*  --   BUN 19  18 26*  --   CREATININE 0.88 0.85 1.07  --   CALCIUM 7.7* 7.9* 7.8*  --   MG  --   --   --  1.9   Liver Function Tests:  Recent Labs Lab 04/05/15 2224 04/07/15 0548  AST 26 28  ALT 14* 14*  ALKPHOS 96 75  BILITOT 1.1 0.8  PROT 7.0 6.5  ALBUMIN 2.0* 1.9*   No results for input(s): LIPASE, AMYLASE in the last 168 hours. No results for input(s): AMMONIA in the last 168 hours. CBC:  Recent Labs Lab 04/05/15 2224 04/06/15 0346 04/07/15 0548  WBC 5.2 5.9 4.0  NEUTROABS 4.6  --  2.4  HGB 8.5* 9.2* 8.9*  HCT 28.2* 30.3* 29.3*  MCV 77.9* 77.7* 79.6  PLT 114* 122* 128*   Cardiac Enzymes: No results for  input(s): CKTOTAL, CKMB, CKMBINDEX, TROPONINI in the last 168 hours. BNP: Invalid input(s): POCBNP CBG: No results for input(s): GLUCAP in the last 168 hours.  Recent Results (from the past 240 hour(s))  Urine culture     Status: None   Collection Time: 04/06/15  6:38 AM  Result Value Ref Range Status   Specimen Description URINE, CLEAN CATCH  Final   Special Requests NONE  Final   Colony Count   Final    7,000 COLONIES/ML Performed at [facility]    Culture   Final    INSIGNIFICANT GROWTH Performed at [facility]    Report Status 04/07/2015 FINAL  Final     Studies:              All Imaging reviewed and is as per above notation   Scheduled Meds: . albumin human  50 g Intravenous Once  . enoxaparin (LOVENOX) injection  40 mg Subcutaneous Daily  . furosemide  40 mg Oral Daily  . lidocaine (PF)      . potassium chloride  40 mEq Oral Daily  . propranolol ER  60 mg Oral Daily  . sodium chloride  3 mL Intravenous Q12H  . sodium chloride  3 mL Intravenous Q12H  . spironolactone  100 mg Oral Daily  . vancomycin  1,250 mg Intravenous Q12H   Continuous Infusions:    Assessment/Plan:  1. Decompensated cirrhosis, MELD=15-not heavy drinker now [facility][heavy drinker 8-9 yr ago-1/5th a night on a Friday], will order Hepatitis panel [tells me might have been exposed to hepatitis as a younger man-multiple prison tattoos as well], HIV, etc etc-unclear inciting event-poor understanding of diet-Nutritionist will need to see.  LVP done 5/29 s/p 2 liters ascites drained.  Replaced with IV albumin 50 mg x 1.  On Aldactone:lasix 100:40 now. -2.07 liters. Low salt diet.   2. Cellulitis scrotum/legs and wounds-appreciate WOC nurse-continue vancomycin IV for now. 3. AKI-2/2 to diuretics-will monitor volume and creatinine am.  Will adjust diuresis as prn 4. Rate controlled chr Afib-Chadscore>-poor candidate for Coumadin given cirrhosis and current 5. Iron def anemia-hemoccult.  Likely  NASH.  US at [facility]=Cirrhosis + splenomegaly-has never had colonoscopy?-need screening varices endo as OP  Code Status: full Family Communication:  20 min discussion c family Disposition Plan: inpatient-monitor   Pleas KochJai Milessa Hogan, MD  [facility] Pager [phone] 04/07/2015, 4:16 PM    LOS: 1 day

## 2015-04-07 NOTE — Consult Note (Signed)
WOC wound consult note Reason for Consult:Bilateral LEs with chronic venous insufficiency and full thickness wounds.  Patient has been treated for the past 8 years at [facility] [place]. [facility], but that they had to stop coming because they "used up all their visits" and his "legs didn't get better". Wound type:Venous insufficiency with skin changes and open, full thickness wounds. Pressure Ulcer POA: No Measurement: Numerous open areas on bilateral LEs, on the left, the two largest measure Mediall: 4.5cm x 3.5cm and Anterior: 3cm x 5cm. The depth of both is obscured by the presence of adherent yellow slough. On the right, the two largest measure Distal: 4cm x 3cm and Proximal 3cm x 4cm.  The depth of both is obscured by the presence of yellow slough. Wound bed:See above. Drainage (amount, consistency, odor) LEs have been weeping, albeit this is less since LEs have been elevated and patient is in bed. Periwound: Bilateral LEs with chronic changes due to moisture, numerous elevations, and depressions.  Toenails are long and hygiene is poor. Dressing procedure/placement/frequency: I will provide orders for topical conservative care and request Ortho Tech to come and place Unna's Boots bilaterally.  They will add to their schedule for twice weekly changes while here.  [facility] or [facility] that the [facility] for maintenance. If you agree, Please order. WOC nursing team will not follow, but will remain available to this patient, the nursing and medical teams.  Please re-consult if needed. Thanks, Ladona MowLaurie Timoth Schara, MSN, RN, GNP, [place]WOCN, CWON-AP [phone])

## 2015-04-07 NOTE — Clinical Social Work Note (Signed)
Clinical Social Work Assessment  Patient Details  Name: Isaac Ashley MRN: [id number] Date of Birth: [birth year]  Date of referral:  04/07/15               Reason for consult:  Facility Placement, Discharge Planning                Permission sought to share information with:  Other (No permission granted.) Permission granted to share information::  No  Name::        Agency::     Relationship::     Contact Information:     Housing/Transportation Living arrangements for the past 2 months:  Single Family Home Source of Information:  Patient Patient Interpreter Needed:  None Criminal Activity/Legal Involvement Pertinent to Current Situation/Hospitalization:  No - Comment as needed Significant Relationships:  Adult Children, Spouse Lives with:  Adult Children, Spouse Do you feel safe going back to the place where you live?  Yes Need for family participation in patient care:  No (Coment)  Care giving concerns:  Patient expressed no concerns at this time.   Social Worker assessment / plan:  CSW received referral for possible SNF placement at time of discharge. CSW spoke with patient regarding discharge disposition. Patient understanding of PT recommendation for SNF placement at time of discharge, however, patient currently refusing SNF placement. Per patient, patient will return home with patient'Ashley wife and patient'Ashley son once medically stable. Patient reports patient has support at home. CSW signing off.  Employment status:  Retired Health and safety inspectornsurance information:  Medicare PT Recommendations:  Skilled Nursing Facility Information / Referral to community resources:  Other (Comment Required) (Patient refusing SNF placement.)  Patient/Family'Ashley Response to care:  Patient understanding of PT recommendation and currently refusing SNF placement.  Patient/Family'Ashley Understanding of and Emotional Response to Diagnosis, Current Treatment, and Prognosis:  Patient understanding of PT recommendation and  currently refusing SNF placement.  Emotional Assessment Appearance:  Appears stated age Attitude/Demeanor/Rapport:  Other (Pleasant.) Affect (typically observed):  Appropriate, Accepting, Calm, Pleasant Orientation:  Oriented to Self, Oriented to Place, Oriented to  Time, Oriented to Situation Alcohol / Substance use:  Not Applicable Psych involvement (Current and /or in the community):  No (Comment) (Not appropriate on this admission.)  Discharge Needs  Concerns to be addressed:  No discharge needs identified Readmission within the last 30 days:  No Current discharge risk:  None Barriers to Discharge:  No Barriers Identified   Rod MaeVaughn, Isaac Kitchings S, LCSW 04/07/2015, 2:08 PM [phone]

## 2015-04-07 NOTE — Procedures (Signed)
Successful US guided paracentesis from RUQ.  Yielded 2.1 liters of clear light yellow fluid.  No immediate complications.  Pt tolerated well.   Specimen was not sent for labs.  Pattricia BossMORGAN, Dazha Kempa D PA-C 04/07/2015 12:06 PM

## 2015-04-07 NOTE — Progress Notes (Signed)
Orthopedic Tech Progress Note Patient Details:  Isaac FolksGrover C Ashley 02/22/1946 [id number]  Ortho Devices Type of Ortho Device: Roland RackUnna boot Ortho Device/Splint Location: (B) LE Ortho Device/Splint Interventions: Ordered, Application   Jennye MoccasinHughes, Jakyle Petrucelli Craig 04/07/2015, 4:52 PM

## 2015-04-08 LAB — COMPREHENSIVE METABOLIC PANEL
ALT: 13 U/L — ABNORMAL LOW (ref 17–63)
ANION GAP: 5 (ref 5–15)
AST: 25 U/L (ref 15–41)
Albumin: 2.4 g/dL — ABNORMAL LOW (ref 3.5–5.0)
Alkaline Phosphatase: 67 U/L (ref 38–126)
BUN: 28 mg/dL — AB (ref 6–20)
CALCIUM: 7.8 mg/dL — AB (ref 8.9–10.3)
CO2: 28 mmol/L (ref 22–32)
Chloride: 101 mmol/L (ref 101–111)
Creatinine, Ser: 1.13 mg/dL (ref 0.61–1.24)
GLUCOSE: 117 mg/dL — AB (ref 65–99)
Potassium: 4 mmol/L (ref 3.5–5.1)
SODIUM: 134 mmol/L — AB (ref 135–145)
TOTAL PROTEIN: 6.8 g/dL (ref 6.5–8.1)
Total Bilirubin: 0.9 mg/dL (ref 0.3–1.2)

## 2015-04-08 LAB — OCCULT BLOOD X 1 CARD TO LAB, STOOL: Fecal Occult Bld: NEGATIVE

## 2015-04-08 LAB — CBC WITH DIFFERENTIAL/PLATELET
BASOS ABS: 0 10*3/uL (ref 0.0–0.1)
BASOS PCT: 1 % (ref 0–1)
Eosinophils Absolute: 0.3 10*3/uL (ref 0.0–0.7)
Eosinophils Relative: 7 % — ABNORMAL HIGH (ref 0–5)
HEMATOCRIT: 26.8 % — AB (ref 39.0–52.0)
HEMOGLOBIN: 8.3 g/dL — AB (ref 13.0–17.0)
LYMPHS ABS: 0.7 10*3/uL (ref 0.7–4.0)
Lymphocytes Relative: 16 % (ref 12–46)
MCH: 23.9 pg — ABNORMAL LOW (ref 26.0–34.0)
MCHC: 31 g/dL (ref 30.0–36.0)
MCV: 77.2 fL — ABNORMAL LOW (ref 78.0–100.0)
MONO ABS: 0.7 10*3/uL (ref 0.1–1.0)
MONOS PCT: 15 % — AB (ref 3–12)
Neutro Abs: 2.8 10*3/uL (ref 1.7–7.7)
Neutrophils Relative %: 62 % (ref 43–77)
Platelets: 105 10*3/uL — ABNORMAL LOW (ref 150–400)
RBC: 3.47 MIL/uL — ABNORMAL LOW (ref 4.22–5.81)
RDW: 19.2 % — AB (ref 11.5–15.5)
WBC: 4.4 10*3/uL (ref 4.0–10.5)

## 2015-04-08 LAB — PROTIME-INR
INR: 1.48 (ref 0.00–1.49)
PROTHROMBIN TIME: 18 s — AB (ref 11.6–15.2)

## 2015-04-08 NOTE — Plan of Care (Signed)
Problem: Food- and Nutrition-Related Knowledge Deficit (NB-1.1) Goal: Nutrition education Formal process to instruct or train a patient/client in a skill or to impart knowledge to help patients/clients voluntarily manage or modify food choices and eating behavior to maintain or improve health. Outcome: Adequate for Discharge Received consult for diet education. Provided pt with handouts "Cirrhosis Nutrition Therapy" and "Low Sodium Nutrition Therapy" by the Academy of Nutrition and Dietetics.  Pt states he does not salt his food and eats mostly at home (wife cooks low sodium).  Diet recall shows high intakes of several high sodium foods, including pickles and canned soups. Provided pt with list of foods to avoid. Walked pt through handouts explaining the importance of reading nutrition labels to help reduce salt in the diet. Emphasized importance of eating a balanced diet and consuming several small meals and snacks through the day. Provided tips to reduce fluid intake and ways to alleviate dry mouth.  Suggested considering a multivitamin supplement.  Encouraged pt to consume high quality protein and stay active whenever possible.  Pt verbalized understanding, all pt's questions were answered, teach-back method used, expect good compliance.   Tilton Marsalis A. [facility] Dietetic Intern Pager: [phone] 04/08/2015 11:01 AM

## 2015-04-08 NOTE — Progress Notes (Signed)
Isaac Ashley ZOX:[id number] DOB: Jul 10, 1946 DOA: 04/05/2015 PCP: [name], MD  Brief narrative:  69 y/o ? recent admission 4/28--?03/10/15 to [facility] c Acute LE cellulitis. During that Hospital stay, he developed Afib c RVR and was started on lopressor BID. He has a chronic Afib h/o and was previously controlled on Cardizem He has chronic lymphedema per report ~7-8 yrs duration. 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 He has had multiple back surgeries-I did not ask him sexual orientation or h/o sexual contact in prison He is unsure if he has ever had hepatitis He was Rx there initially Vancomycin and Zosyn and sent home on Bactrim. In either event-He was noticed to be swollen over the past 2-3 days and came to [facility] ed for evalutaion He was supposed to follow up post-Hospital stay with Dr. [name] in [place] on d/c 5/1  Past medical history-As per Problem list Chart reviewed as below- Reviewed and reviewed [facility] notes  Consultants:  Wound nurse   Procedures:Measurement: Numerous open areas on bilateral LEs, on the left, the two largest measure Mediall: 4.5cm x 3.5cm and Anterior: 3cm x 5cm. The depth of both is obscured by the presence of adherent yellow slough. On the right, the two largest measure Distal: 4cm x 3cm and Proximal 3cm x 4cm.  The depth of both is obscured by the presence of yellow slough. Wound bed:See above. Drainage (amount, consistency, odor) LEs have been weeping, albeit this is less since LEs have been elevated and patient is in bed. Periwound: Bilateral LEs with chronic changes due to moisture, numerous elevations, and depressions.  Toenails are long and hygiene is poor. Dressing procedure/placement/frequency: I will provide orders for topical conservative care and request Ortho Tech to come and place Unna's Boots bilaterally.  They will add to their schedule for twice weekly  changes while here.  Patient will require either [facility] or confirmation that the patient can get to the [facility] W[facility] care center for maintenance. If you agree, Please order.   Antibiotics:  Iv vanc on admit 5/27   Subjective   Better Feels less swollen No cp no f chills sob nor other issues currently   Objective    Interim History:   Telemetry: fib   Objective: Filed Vitals:   04/07/15 1338 04/07/15 2245 04/08/15 0200 04/08/15 0519  BP: 108/61 106/57  107/70  Pulse: 85 87  79  Temp: 97.2 F (36.2 C) 98.1 F (36.7 C)  98.2 F (36.8 C)  TempSrc: Oral Oral  Oral  Resp: Height:      Weight:    136.261 kg (300 lb 6.4 oz)  SpO2: 95% 95% 94% 96%    Intake/Output Summary (Last 24 hours) at 04/08/15 1111 Last data filed at 04/08/15 0819  Gross per 24 hour  Intake    720 ml  Output    875 ml  Net   -155 ml    Exam:  General: eomi, ncat Cardiovascular: s1 s2 no m/r/g Respiratory: clear no added soudn Abdomen:  Obese-less firm Skin wounds briefly examined-  right lower extremity     Left lower extremity Neuro intact-no tremor nor asterixis  Data Reviewed: Basic Metabolic Panel:  Recent Labs Lab 04/05/15 2224 04/06/15 0346 04/07/15 0548 04/07/15 1230 04/08/15 0521  NA 137 135 134*  --  134*  K 3.4* 3.2* 3.8  --  4.0  CL 102 99* 100*  --  101  CO2  27 28 30   --  28  GLUCOSE 119* 113* 135*  --  117*  BUN 19 18 26*  --  28*  CREATININE 0.88 0.85 1.07  --  1.13  CALCIUM 7.7* 7.9* 7.8*  --  7.8*  MG  --   --   --  1.9  --    Liver Function Tests:  Recent Labs Lab 04/05/15 2224 04/07/15 0548 04/08/15 0521  AST 26 28 25   ALT 14* 14* 13*  ALKPHOS 96 75 67  BILITOT 1.1 0.8 0.9  PROT 7.0 6.5 6.8  ALBUMIN 2.0* 1.9* 2.4*   No results for input(s): LIPASE, AMYLASE in the last 168 hours. No results for input(s): AMMONIA in the last 168 hours. CBC:  Recent Labs Lab 04/05/15 2224 04/06/15 0346 04/07/15 0548  04/08/15 0521  WBC 5.2 5.9 4.0 4.4  NEUTROABS 4.6  --  2.4 2.8  HGB 8.5* 9.2* 8.9* 8.3*  HCT 28.2* 30.3* 29.3* 26.8*  MCV 77.9* 77.7* 79.6 77.2*  PLT 114* 122* 128* 105*   Cardiac Enzymes: No results for input(s): CKTOTAL, CKMB, CKMBINDEX, TROPONINI in the last 168 hours. BNP: Invalid input(s): POCBNP CBG: No results for input(s): GLUCAP in the last 168 hours.  Recent Results (from the past 240 hour(s))  Urine culture     Status: None   Collection Time: 04/06/15  6:38 AM  Result Value Ref Range Status   Specimen Description URINE, CLEAN CATCH  Final   Special Requests NONE  Final   Colony Count   Final    7,000 COLONIES/ML Performed at [facility]    Culture   Final    INSIGNIFICANT GROWTH Performed at [facility]    Report Status 04/07/2015 FINAL  Final     Studies:              All Imaging reviewed and is as per above notation   Scheduled Meds: . enoxaparin (LOVENOX) injection  40 mg Subcutaneous Daily  . potassium chloride  40 mEq Oral Daily  . propranolol ER  60 mg Oral Daily  . sodium chloride  3 mL Intravenous Q12H  . sodium chloride  3 mL Intravenous Q12H  . spironolactone  100 mg Oral Daily  . vancomycin  1,250 mg Intravenous Q12H   Continuous Infusions:    Assessment/Plan:  1. 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  LVP done 5/29 s/p 2 liters ascites drained.  Replaced with IV albumin 50 mg x 1.  Was on Aldactone:lasix 100:40, but given mild ? creatinine, hold Lasix 5/30. weights and fluid status did not appear accurate--he will need continued diuresis as an outpatient and I will refer [facility] for further follow-up 2. Cellulitis scrotum/legs and wounds-appreciate WOC nurse-continue vancomycin  IV for now--given no fever or chills X3 days on IV vancomycin, by mouth doxycycline twice a day to complete 14 days purulent cellulitis coverage.  [facility] l[facility]hedema management and may be physical therapy for wrapping of his lower extremities-pictures taken 5/30 as above 3. AKI-2/2 to diuretics-will monitor volume and creatinine am. see above discussion 4. Rate controlled chr Afib-Chadscore>-poor candidate for Coumadin given cirrhosis and currentplatelet count in the low 100 range 5. microcytic anemia Iron def anemia-hemoccult performed 5/30, contribution of liver  disease plus cirrhosis 6. 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  [place] at [facility]=Cirrhosis + splenomegaly-has never had colonoscopy?-need screening varices endo as OP.  Code Status: full Family Communication:  20 min discussion c family Disposition Plan: therapy is recommended skilled nursing coverage-I will speak to social worker regarding this   Pleas Koch, MD  [facility] Pager [phone] 04/08/2015, 11:11 AM    LOS: 2 days

## 2015-04-08 NOTE — Progress Notes (Deleted)
Placed patient on BiPAP due to increased RR. Setting 10/5 and 40%  Rate is decreasing  RT will continue to monitor 

## 2015-04-08 NOTE — Progress Notes (Signed)
ANTIBIOTIC CONSULT NOTE  Pharmacy Consult for Vancomycin Indication: cellulitis  Allergies  Allergen Reactions  . Codeine Nausea Only  . Hydrocodone Nausea Only  . Penicillins Rash    Patient Measurements: Height: 6\' 1"  (185.4 cm) Weight: (!) 300 lb 6.4 oz (136.261 kg) IBW/kg (Calculated) : 79.9   Vital Signs: Temp: 98.2 F (36.8 C) (05/30 0519) Temp Source: Oral (05/30 0519) BP: 107/70 mmHg (05/30 0519) Pulse Rate: 79 (05/30 0519) Intake/Output from previous day: 05/29 0701 - 05/30 0700 In: 1220 [P.O.:720; IV Piggyback:500] Out: 825 [Urine:825] Intake/Output from this shift: Total I/O In: 360 [P.O.:360] Out: 375 [Urine:375]  Labs:  Recent Labs  04/06/15 0346 04/07/15 0548 04/08/15 0521  WBC 5.9 4.0 4.4  HGB 9.2* 8.9* 8.3*  PLT 122* 128* 105*  CREATININE 0.85 1.07 1.13   Estimated Creatinine Clearance: 90.7 mL/min (by C-G formula based on Cr of 1.13). No results for input(s): VANCOTROUGH, VANCOPEAK, VANCORANDOM, GENTTROUGH, GENTPEAK, GENTRANDOM, TOBRATROUGH, TOBRAPEAK, TOBRARND, AMIKACINPEAK, AMIKACINTROU, AMIKACIN in the last 72 hours.   Microbiology: Recent Results (from the past 720 hour(s))  Urine culture     Status: None   Collection Time: 04/06/15  6:38 AM  Result Value Ref Range Status   Specimen Description URINE, CLEAN CATCH  Final   Special Requests NONE  Final   Colony Count   Final    7,000 COLONIES/ML Performed at [facility]    Culture   Final    INSIGNIFICANT GROWTH Performed at [facility]    Report Status 04/07/2015 FINAL  Final    Medical History: Past Medical History  Diagnosis Date  . Hypertension    Assessment: 69yo male with history of Afib and LE edema presents with increased swelling of groin, weakness, and SOB. Pharmacy consulted to dose vancomycin for cellulitis.   Pt is currently afebrile, WBC 4.4, sCr up slightly to 1.1.  5/28 urine - ng  Goal of Therapy:  Vancomycin trough level 10-15  mcg/ml  Plan:  Continue vancomycin 1250mg  q12h Measure antibiotic drug levels at steady state Follow up culture results, renal function, and clinical course Likely change to po abx soon  Sheppard CoilFrank Wilson PharmD., BCPS Clinical Pharmacist Pager [phone] 04/08/2015 1:43 PM

## 2015-04-08 NOTE — Progress Notes (Signed)
CSW order again received to assist patient with SNF placement.  Patient has been assessed over the weekend by Marcelline DeistEmily Vaughn, LCSWA on 04/07/15.  Patient declined SNF option and plans to return home with his wife and family. CSW services has signed off.  Plan home with home health when medically stable.  CSW signing off again but available if needed.  Thanks!  Lorri Frederickonna T. Jaci LazierCrowder, KentuckyLCSW [phone]

## 2015-04-08 NOTE — Progress Notes (Signed)
PT Cancellation Note  Patient Details Name: Isaac Ashley MRN: [id number] DOB: [birth year]   Cancelled Treatment:    Reason Eval/Treat Not Completed: Medical issues which prohibited therapy (waiting for UNNA wraps on legs. )  Noted orfor lymphadema  Wrapping and instruction. Spoke with PT supervisor who states that LE lymphadema techniques not indicated in this patient without ability to return to outpatient specialty clinic. Also, patient has recommendations for Unna boots per WOC written 04/07/15. The amount of drainage wound not be indicated for lymphadema wrapping. Patient is not interested in SNF at this time.   Rada HayHill, Isaac Ashley 04/08/2015, 2:11 PM Blanchard KelchKaren Valdez Brannan PT [phone]

## 2015-04-08 NOTE — Progress Notes (Signed)
Orthopedic Tech Progress Note Patient Details:  Isaac FolksGrover C Ashley 1946-02-27 [id number] Xeroform applied to Bil. LEs followed by unna boots as order requested. Patient tolerated application well.  Ortho Devices Type of Ortho Device: Ace wrap, Unna boot Ortho Device/Splint Location: Bilateral Ortho Device/Splint Interventions: Application   Asia R Thompson 04/08/2015, 2:55 PM

## 2015-04-09 LAB — COMPREHENSIVE METABOLIC PANEL
ALK PHOS: 72 U/L (ref 38–126)
ALT: 13 U/L — ABNORMAL LOW (ref 17–63)
AST: 24 U/L (ref 15–41)
Albumin: 2.4 g/dL — ABNORMAL LOW (ref 3.5–5.0)
Anion gap: 8 (ref 5–15)
BILIRUBIN TOTAL: 1 mg/dL (ref 0.3–1.2)
BUN: 27 mg/dL — AB (ref 6–20)
CHLORIDE: 102 mmol/L (ref 101–111)
CO2: 25 mmol/L (ref 22–32)
Calcium: 7.9 mg/dL — ABNORMAL LOW (ref 8.9–10.3)
Creatinine, Ser: 1.01 mg/dL (ref 0.61–1.24)
GLUCOSE: 114 mg/dL — AB (ref 65–99)
POTASSIUM: 4.5 mmol/L (ref 3.5–5.1)
Sodium: 135 mmol/L (ref 135–145)
Total Protein: 6.7 g/dL (ref 6.5–8.1)

## 2015-04-09 LAB — HEPATITIS PANEL, ACUTE
HCV AB: 0.2 {s_co_ratio} — AB (ref 0.0–0.9)
HEP A IGM: NEGATIVE — AB
HEP B C IGM: NEGATIVE — AB
HEP B S AG: NEGATIVE — AB

## 2015-04-09 LAB — CBC
HCT: 27.5 % — ABNORMAL LOW (ref 39.0–52.0)
Hemoglobin: 8.4 g/dL — ABNORMAL LOW (ref 13.0–17.0)
MCH: 23.6 pg — ABNORMAL LOW (ref 26.0–34.0)
MCHC: 30.5 g/dL (ref 30.0–36.0)
MCV: 77.2 fL — ABNORMAL LOW (ref 78.0–100.0)
PLATELETS: 113 10*3/uL — AB (ref 150–400)
RBC: 3.56 MIL/uL — AB (ref 4.22–5.81)
RDW: 19.3 % — AB (ref 11.5–15.5)
WBC: 4.7 10*3/uL (ref 4.0–10.5)

## 2015-04-09 LAB — HIV ANTIBODY (ROUTINE TESTING W REFLEX): HIV SCREEN 4TH GENERATION: NONREACTIVE

## 2015-04-09 MED ORDER — FUROSEMIDE 40 MG PO TABS
40.0000 mg | ORAL_TABLET | Freq: Every day | ORAL | Status: DC
  Administered 2015-04-09 – 2015-04-11 (×3): 40 mg via ORAL
  Filled 2015-04-09 (×3): qty 1

## 2015-04-09 MED ORDER — OXYCODONE HCL 5 MG PO TABS
5.0000 mg | ORAL_TABLET | Freq: Four times a day (QID) | ORAL | Status: DC | PRN
  Administered 2015-04-09 – 2015-04-10 (×3): 5 mg via ORAL
  Filled 2015-04-09 (×4): qty 1

## 2015-04-09 MED ORDER — DOXYCYCLINE HYCLATE 100 MG PO TABS
100.0000 mg | ORAL_TABLET | Freq: Two times a day (BID) | ORAL | Status: DC
  Administered 2015-04-09 – 2015-04-11 (×5): 100 mg via ORAL
  Filled 2015-04-09 (×6): qty 1

## 2015-04-09 NOTE — Progress Notes (Addendum)
Physical Therapy Treatment Patient Details Name: Isaac FolksGrover C Sowle MRN: [id number] DOB: May 17, 1946 Today's Date: 04/09/2015    History of Present Illness Patient is a 69 yo male admitted 04/05/15 with swollen scrotum and LE's.  Patient with gross fluid overload and chronic lymphedema (severe) per chart.  PMH: HTN, chronic lymphedema, back surgery, Afib, ICH from fall - surgery    PT Comments    Patient tolerated  Sitting at the edge of the bed. Patient reports that he is able to [facility] to [facility] with  Assist of 1 and declines to stand at the present. Patient indicates that he plans to go home. He states that  The legs feel better with the unna wraps.   Patient may require  Nonemergency transport  When discharged.  Follow Up Recommendations  SNF;Supervision/Assistance - 24 hour (patient declines snf, has  Hopital bed, WC at home)     Equipment Recommendations  None recommended by PT    Recommendations for Other Services       Precautions / Restrictions Precautions Precautions: Fall Precaution Comments: , has weeping LE's, unna boots    Mobility  Bed Mobility     Rolling: Min guard   Supine to sit: Min assist;HOB elevated Sit to supine: Mod assist   General bed mobility comments: use of rails for rolling  and sliding up, assist legs onto bed.  Transfers                    Ambulation/Gait                 Stairs            Wheelchair Mobility    Modified Rankin (Stroke Patients Only)       Balance Overall balance assessment: History of Falls Sitting-balance support: Feet supported;No upper extremity supported Sitting balance-Leahy Scale: Fair                              Cognition Arousal/Alertness: Awake/alert                          Exercises      General Comments        Pertinent Vitals/Pain Pain Score: 9  Pain Location: back Pain Descriptors / Indicators: Aching Pain Intervention(s): Limited activity within  patient's tolerance;Monitored during session;Premedicated before session;Repositioned    Home Living                      Prior Function            PT Goals (current goals can now be found in the care plan section) Progress towards PT goals: Progressing toward goals    Frequency  Min 3X/week    PT Plan Current plan remains appropriate    Co-evaluation             End of Session   Activity Tolerance: Patient tolerated treatment well Patient left: in bed;with call bell/phone within reach     Time: [id number] PT Time Calculation (min) (ACUTE ONLY): 37 min  Charges:  $Therapeutic Activity: 23-37 mins                    G Codes:      Rada HayHill, Jamilah Jean Elizabeth 04/09/2015, 11:29 AM Blanchard KelchKaren Karima Carrell PT [phone]

## 2015-04-09 NOTE — Progress Notes (Signed)
Isaac Ashley ZOX:[id number] DOB: September 24, 1946 DOA: 04/05/2015 PCP: [name], MD  Brief narrative:  69 y/o ? recent admission 4/28--?03/10/15 to [facility] c Acute LE cellulitis. During that Hospital stay, he developed Afib c RVR and was started on lopressor BID. He has a chronic Afib h/o and was previously controlled on Cardizem He has chronic lymphedema per report ~7-8 yrs duration. 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 He has had multiple back surgeries- He is unsure if he has ever had hepatitis He was Rx there initially Vancomycin and Zosyn and sent home on Bactrim. In either event-He was noticed to be swollen over the past 2-3 days and came to [facility] ed for evalutaion I reviewed the nottes from [facility] ands it appears he was d/c home on appropriate therapies and diuretics.  On amdission here it doesn;t appear like he was taking them at home He was supposed to follow up post-Hospital stay with Dr. [name] in [place] on d/c 5/1  Past medical history-As per Problem list Chart reviewed as below- Reviewed and reviewed [facility] notes  Consultants:  Wound nurse   Procedures:Measurement: Numerous open areas on bilateral LEs, on the left, the two largest measure Mediall: 4.5cm x 3.5cm and Anterior: 3cm x 5cm. The depth of both is obscured by the presence of adherent yellow slough. On the right, the two largest measure Distal: 4cm x 3cm and Proximal 3cm x 4cm.  The depth of both is obscured by the presence of yellow slough. Wound bed:See above. Drainage (amount, consistency, odor) LEs have been weeping, albeit this is less since LEs have been elevated and patient is in bed. Periwound: Bilateral LEs with chronic changes due to moisture, numerous elevations, and depressions.  Toenails are long and hygiene is poor. Dressing procedure/placement/frequency: I will provide orders for topical conservative care and  request Ortho Tech to come and place Unna's Boots bilaterally.  They will add to their schedule for twice weekly changes while here.  Patient will require either [facility] or confirmation that the patient can get to the [facility] center for maintenance. If you agree, Please order.   Antibiotics:  Iv vanc on admit 5/27   Subjective   Doing fair.  No cp  No sob Still swollen No fever no chills Wasn't being weighed on standing scale.   Objective    Interim History:   Telemetry: fib   Objective: Filed Vitals:   04/09/15 0517 04/09/15 1041 04/09/15 1200 04/09/15 1318  BP: 117/67 109/63  113/63  Pulse: 85 90  84  Temp: 98.3 F (36.8 C) 98.2 F (36.8 C)  98.1 F (36.7 C)  TempSrc: Oral Oral  Oral  Resp: Height:      Weight: 136 kg (299 lb 13.2 oz)  135.4 kg (298 lb 8.1 oz)   SpO2: 99% 96%  94%    Intake/Output Summary (Last 24 hours) at 04/09/15 1722 Last data filed at 04/09/15 1428  Gross per 24 hour  Intake   2010 ml  Output    600 ml  Net   1410 ml    Exam:  General: eomi, ncat Cardiovascular: s1 s2 no m/r/g Respiratory: clear no added soudn Abdomen:  Obese-less firm Skin wounds briefly examined on 5/30  right lower extremity     Left lower extremity Neuro intact-no tremor nor asterixis  Data Reviewed: Basic Metabolic Panel:  Recent Labs Lab 04/05/15 2224 04/06/15 0346 04/07/15 0548 04/07/15  1230 04/08/15 0521 04/09/15 0431  NA 137 135 134*  --  134* 135  K 3.4* 3.2* 3.8  --  4.0 4.5  CL 102 99* 100*  --  101 102  CO2 --  28 25  GLUCOSE 119* 113* 135*  --  117* 114*  BUN 19 18 26*  --  28* 27*  CREATININE 0.88 0.85 1.07  --  1.13 1.01  CALCIUM 7.7* 7.9* 7.8*  --  7.8* 7.9*  MG  --   --   --  1.9  --   --    Liver Function Tests:  Recent Labs Lab 04/05/15 2224 04/07/15 0548 04/08/15 0521 04/09/15 0431  AST ALT 14* 14* 13* 13*  ALKPHOS 96 75 67 72  BILITOT 1.1 0.8 0.9 1.0    PROT 7.0 6.5 6.8 6.7  ALBUMIN 2.0* 1.9* 2.4* 2.4*   No results for input(s): LIPASE, AMYLASE in the last 168 hours. No results for input(s): AMMONIA in the last 168 hours. CBC:  Recent Labs Lab 04/05/15 2224 04/06/15 0346 04/07/15 0548 04/08/15 0521 04/09/15 0431  WBC 5.2 5.9 4.0 4.4 4.7  NEUTROABS 4.6  --  2.4 2.8  --   HGB 8.5* 9.2* 8.9* 8.3* 8.4*  HCT 28.2* 30.3* 29.3* 26.8* 27.5*  MCV 77.9* 77.7* 79.6 77.2* 77.2*  PLT 114* 122* 128* 105* 113*   Cardiac Enzymes: No results for input(s): CKTOTAL, CKMB, CKMBINDEX, TROPONINI in the last 168 hours. BNP: Invalid input(s): POCBNP CBG: No results for input(s): GLUCAP in the last 168 hours.  Recent Results (from the past 240 hour(s))  Urine culture     Status: None   Collection Time: 04/06/15  6:38 AM  Result Value Ref Range Status   Specimen Description URINE, CLEAN CATCH  Final   Special Requests NONE  Final   Colony Count   Final    7,000 COLONIES/ML Performed at [facility]    Culture   Final    INSIGNIFICANT GROWTH Performed at [facility]    Report Status 04/07/2015 FINAL  Final     Studies:              All Imaging reviewed and is as per above notation   Scheduled Meds: . doxycycline  100 mg Oral Q12H  . enoxaparin (LOVENOX) injection  40 mg Subcutaneous Daily  . potassium chloride  40 mEq Oral Daily  . propranolol ER  60 mg Oral Daily  . sodium chloride  3 mL Intravenous Q12H  . sodium chloride  3 mL Intravenous Q12H  . spironolactone  100 mg Oral Daily   Continuous Infusions:    Assessment/Plan:  1. 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  LVP done 5/29 s/p 2 liters ascites drained.  Replaced with IV albumin 50 mg x 1.  Was on Aldactone:lasix 100:40, but given mild ? creatinine,  held Lasix 5/30-->restarted Lasix 04/09/15. I Do not think he will comply with OP therapies--I had a long discussion with his son and patient will be consider being d/c to SNF when I spoke with son--I suspect patient was non-compliant with recommendations/medications on leaving [facility] 2. Cellulitis scrotum/legs and wounds-appreciate WOC nurse-continue vancomycin IV for now--given no fever or chills X3 days on IV vancomycin, by mouth doxycycline 5/31 twice a day  to complete 14 days purulent cellulitis coverage.  [facility] and may be physical therapy for wrapping of his lower extremities-pictures taken 5/30 as above 3. AKI-2/2 to diuretics-will monitor volume and creatinine am. see above discussion 4. Rate controlled chr Afib-Chadscore>-poor candidate for Coumadin given cirrhosis and current platelet count in the low 100 range 5. Microcytic anemia Iron def anemia-hemoccult performed 5/30, contribution of liver disease plus cirrhosis 6. 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  US at [facility]=Cirrhosis + splenomegaly-has never had colonoscopy?-need screening varices endo as OP.  Code Status: full Family Communication:  Called son-son will d/w father again about goignt o SNF Disposition Plan: therapy is recommended skilled nursing coverage.  Patient and son to discuss this moving forward.  He will need 2-3 days more diuresis and OP Gi f/u   Pleas KochJai Telena Peyser, MD  [facility] Pager [phone] 04/09/2015, 5:22 PM    LOS: 3 days

## 2015-04-10 DIAGNOSIS — I482 Chronic atrial fibrillation: Secondary | ICD-10-CM

## 2015-04-10 DIAGNOSIS — L0390 Cellulitis, unspecified: Secondary | ICD-10-CM

## 2015-04-10 DIAGNOSIS — K7460 Unspecified cirrhosis of liver: Principal | ICD-10-CM

## 2015-04-10 DIAGNOSIS — I890 Lymphedema, not elsewhere classified: Secondary | ICD-10-CM

## 2015-04-10 DIAGNOSIS — R180 Malignant ascites: Secondary | ICD-10-CM

## 2015-04-10 LAB — COMPREHENSIVE METABOLIC PANEL
ALBUMIN: 2.3 g/dL — AB (ref 3.5–5.0)
ALK PHOS: 72 U/L (ref 38–126)
ALT: 12 U/L — ABNORMAL LOW (ref 17–63)
ANION GAP: 8 (ref 5–15)
AST: 25 U/L (ref 15–41)
BUN: 23 mg/dL — AB (ref 6–20)
CALCIUM: 8.1 mg/dL — AB (ref 8.9–10.3)
CO2: 26 mmol/L (ref 22–32)
Chloride: 101 mmol/L (ref 101–111)
Creatinine, Ser: 1.09 mg/dL (ref 0.61–1.24)
Glucose, Bld: 104 mg/dL — ABNORMAL HIGH (ref 65–99)
POTASSIUM: 4.5 mmol/L (ref 3.5–5.1)
SODIUM: 135 mmol/L (ref 135–145)
TOTAL PROTEIN: 6.8 g/dL (ref 6.5–8.1)
Total Bilirubin: 0.9 mg/dL (ref 0.3–1.2)

## 2015-04-10 LAB — CBC WITH DIFFERENTIAL/PLATELET
BASOS ABS: 0 10*3/uL (ref 0.0–0.1)
BASOS PCT: 1 % (ref 0–1)
Eosinophils Absolute: 0.3 10*3/uL (ref 0.0–0.7)
Eosinophils Relative: 8 % — ABNORMAL HIGH (ref 0–5)
HEMATOCRIT: 27.2 % — AB (ref 39.0–52.0)
Hemoglobin: 8.6 g/dL — ABNORMAL LOW (ref 13.0–17.0)
Lymphocytes Relative: 17 % (ref 12–46)
Lymphs Abs: 0.7 10*3/uL (ref 0.7–4.0)
MCH: 24.3 pg — ABNORMAL LOW (ref 26.0–34.0)
MCHC: 31.6 g/dL (ref 30.0–36.0)
MCV: 76.8 fL — AB (ref 78.0–100.0)
Monocytes Absolute: 0.6 10*3/uL (ref 0.1–1.0)
Monocytes Relative: 14 % — ABNORMAL HIGH (ref 3–12)
Neutro Abs: 2.5 10*3/uL (ref 1.7–7.7)
Neutrophils Relative %: 60 % (ref 43–77)
PLATELETS: 89 10*3/uL — AB (ref 150–400)
RBC: 3.54 MIL/uL — AB (ref 4.22–5.81)
RDW: 19.4 % — AB (ref 11.5–15.5)
WBC: 4.1 10*3/uL (ref 4.0–10.5)

## 2015-04-10 MED ORDER — ADULT MULTIVITAMIN W/MINERALS CH
1.0000 | ORAL_TABLET | Freq: Every day | ORAL | Status: DC
  Administered 2015-04-10 – 2015-04-11 (×2): 1 via ORAL
  Filled 2015-04-10 (×3): qty 1

## 2015-04-10 MED ORDER — OXYCODONE HCL 5 MG PO TABS
10.0000 mg | ORAL_TABLET | Freq: Four times a day (QID) | ORAL | Status: DC | PRN
  Administered 2015-04-10 – 2015-04-11 (×3): 10 mg via ORAL
  Filled 2015-04-10 (×3): qty 2

## 2015-04-10 MED ORDER — ADULT MULTIVITAMIN W/MINERALS CH
1.0000 | ORAL_TABLET | Freq: Every day | ORAL | Status: DC
  Filled 2015-04-10: qty 1

## 2015-04-10 MED ORDER — OXYCODONE-ACETAMINOPHEN 5-325 MG PO TABS
1.0000 | ORAL_TABLET | ORAL | Status: DC | PRN
  Administered 2015-04-10: 1 via ORAL
  Filled 2015-04-10: qty 1

## 2015-04-10 NOTE — Progress Notes (Signed)
Nutrition Education Note  RD consulted for nutrition education regarding ETOH cirrhosis. Pt met with dietetic intern earlier this week and discussed low sodium diet and importance of a balanced diet. Pt reports understanding of low sodium and healthful diet. He states he has the handouts and he denies any further questions or concerns at this time. Patient reports fair appetite, eating 75% of meals. RD to order 8 pm snacks and multivitamin.   Labs and medications reviewed. If additional nutrition issues arise, please re-consult RD.   Pryor Ochoa RD, LDN Inpatient Clinical Dietitian Pager: [phone] After Hours Pager: [phone]

## 2015-04-10 NOTE — Progress Notes (Signed)
Introduced self to pt as in coming nurse till 7p.  Call bell at bed side.   Instructed to call for assist. As needed.  Verbalized understanding.  Will continue to monitor.  Amanda PeaNellie Jaleiyah Alas, Charity fundraiserN.

## 2015-04-10 NOTE — Progress Notes (Signed)
Orthopedic Tech Progress Note Patient Details:  Dustin FolksGrover C Hefel 07/28/1946 [id number]  Ortho Devices Type of Ortho Device: Roland RackUnna boot Ortho Device/Splint Location: bilateral Ortho Device/Splint Interventions: Application   Donis Pinder 04/10/2015, 12:01 PM

## 2015-04-10 NOTE — Progress Notes (Signed)
[facility] PROGRESS NOTE  Isaac FolksGrover C Ashley [id number] DOB: Sep 06, 1946 DOA: 04/05/2015 PCP: Lia HoppingHASANAJ,XAJE A, MD  Assessment/Plan: #1 decompensated cirrhosis with ascites Meld = 15. Prior history of significant alcohol use however noted heavy drinker. Patient with some clinical improvement. Hepatitis panel is negative. HIV is negative. Patient with poor understanding of his diet. Patient status post ultrasound-guided paracentesis on 04/07/2015 with 2.1 L of ascitic fluid removed. Patient was given our be given IV 1. Patient was on Aldactone and Lasix however secondary to mild increase in his creatinine thyroid is were held on 04/08/2015 and resume them 04/09/2015. Dr. Mahala MenghiniSamtani he had discussions with patient and his son who are considering skilled nursing facility. There is some concern of noncompliance. Continue current regimen of Lasix and Aldactone. Continue propranolol. Patient will need to follow-up with GI as outpatient.  #2 cellulitis of the scrotum/legs and wounds Patient has been seen by the wound care nurse and currently with wound Unna boots on. Patient was on IV vancomycin and has been transitioned to oral doxycycline to complete a two-week course of anabolic coverage for purulent cellulitis. Patient is to follow-up with the wound care clinic as outpatient and results will for his chronic lymphedema management and physical therapy for wrapping of his lower extremities.  #3 acute renal insufficiency Felt to be secondary to diaphoretic. Renal function currently stable. Monitor closely with diaphoretic.  #4 atrial fibrillation Currently rate controlled. Patient deemed a poor candidate for Coumadin secondary to cirrhosis, thrombocytopenia.  #5 microcytic anemia/iron deficiency anemia Likely secondary to chronic liver disease. Follow H&H. Stable.  #6 Prophylaxis Lovenox for DVT prophylaxis.  Code Status: Full Family Communication: Updated patient. No family at  bedside. Disposition Plan: Skilled nursing facility versus home with home health.   Consultants:  Wound care nurse  Procedures:  CT abdomen and pelvis 04/06/2015  Chest x-ray 04/05/2015  Ultrasound-guided paracentesis with 2.1 L of fluid removed. Per Dr. Fredia SorrowYamagata 04/07/2015  Antibiotics:  Doxycycline 04/09/2015  IV vancomycin 04/06/2015>>>> 04/09/2015  HPI/Subjective: Patient complaining that oxycodone is not helping his chronic pain and would like to have his Percocet back. Patient states shortness of breath is improved. Patient denies any chest pain.  Objective: Filed Vitals:   04/10/15 1300  BP: 104/51  Pulse: 81  Temp: 97.9 F (36.6 C)  Resp: 20    Intake/Output Summary (Last 24 hours) at 04/10/15 1528 Last data filed at 04/10/15 1409  Gross per 24 hour  Intake   1080 ml  Output   2900 ml  Net  -1820 ml   Filed Weights   04/09/15 1200 04/10/15 0514 04/10/15 1122  Weight: 135.4 kg (298 lb 8.1 oz) 134.1 kg (295 lb 10.2 oz) 133.5 kg (294 lb 5 oz)    Exam:   General:  NAD  Cardiovascular: RRR  Respiratory: Some bibasilar crackles otherwise clear.  Abdomen: Obese, distended, soft, nontender to palpation, positive bowel sounds.  Musculoskeletal: No clubbing or cyanosis. Bilateral lower extremities and Unna boots  Data Reviewed: Basic Metabolic Panel:  Recent Labs Lab 04/06/15 0346 04/07/15 0548 04/07/15 1230 04/08/15 0521 04/09/15 0431 04/10/15 0601  NA 135 134*  --  134* 135 135  K 3.2* 3.8  --  4.0 4.5 4.5  CL 99* 100*  --  101 102 101  CO2 28 30  --  28 25 26   GLUCOSE 113* 135*  --  117* 114* 104*  BUN 18 26*  --  28* 27* 23*  CREATININE 0.85 1.07  --  1.13 1.01  1.09  CALCIUM 7.9* 7.8*  --  7.8* 7.9* 8.1*  MG  --   --  1.9  --   --   --    Liver Function Tests:  Recent Labs Lab 04/05/15 2224 04/07/15 0548 04/08/15 0521 04/09/15 0431 04/10/15 0601  AST ALT 14* 14* 13* 13* 12*  ALKPHOS 96 75 67 72 72  BILITOT  1.1 0.8 0.9 1.0 0.9  PROT 7.0 6.5 6.8 6.7 6.8  ALBUMIN 2.0* 1.9* 2.4* 2.4* 2.3*   No results for input(s): LIPASE, AMYLASE in the last 168 hours. No results for input(s): AMMONIA in the last 168 hours. CBC:  Recent Labs Lab 04/05/15 2224 04/06/15 0346 04/07/15 0548 04/08/15 0521 04/09/15 0431 04/10/15 0601  WBC 5.2 5.9 4.0 4.4 4.7 4.1  NEUTROABS 4.6  --  2.4 2.8  --  2.5  HGB 8.5* 9.2* 8.9* 8.3* 8.4* 8.6*  HCT 28.2* 30.3* 29.3* 26.8* 27.5* 27.2*  MCV 77.9* 77.7* 79.6 77.2* 77.2* 76.8*  PLT 114* 122* 128* 105* 113* 89*   Cardiac Enzymes: No results for input(s): CKTOTAL, CKMB, CKMBINDEX, TROPONINI in the last 168 hours. BNP (last 3 results)  Recent Labs  04/05/15 0224  BNP 189.9*    ProBNP (last 3 results) No results for input(s): PROBNP in the last 8760 hours.  CBG: No results for input(s): GLUCAP in the last 168 hours.  Recent Results (from the past 240 hour(s))  Urine culture     Status: None   Collection Time: 04/06/15  6:38 AM  Result Value Ref Range Status   Specimen Description URINE, CLEAN CATCH  Final   Special Requests NONE  Final   Colony Count   Final    7,000 COLONIES/ML Performed at [facility]    Culture   Final    INSIGNIFICANT GROWTH Performed at [facility]    Report Status 04/07/2015 FINAL  Final     Studies: No results found.  Scheduled Meds: . doxycycline  100 mg Oral Q12H  . enoxaparin (LOVENOX) injection  40 mg Subcutaneous Daily  . furosemide  40 mg Oral Daily  . multivitamin with minerals  1 tablet Oral Q breakfast  . potassium chloride  40 mEq Oral Daily  . propranolol ER  60 mg Oral Daily  . sodium chloride  3 mL Intravenous Q12H  . sodium chloride  3 mL Intravenous Q12H  . spironolactone  100 mg Oral Daily   Continuous Infusions:   Principal Problem:   Cirrhosis of liver with ascites Active Problems:   Anasarca   Cellulitis   Lymphedema   Microcytic anemia   Chronic a-fib   s/p traumatic ICH  (intracerebral hemorrhage) requiring neurosurgery after a fall   Ascites of liver    Time spent: 35 minutes    [facility] MD [facility] Pager [phone]. If 7PM-7AM, please contact night-coverage at [email or web address], password [facility] 04/10/2015, 3:28 PM  LOS: 4 days

## 2015-04-11 DIAGNOSIS — D509 Iron deficiency anemia, unspecified: Secondary | ICD-10-CM

## 2015-04-11 DIAGNOSIS — R601 Generalized edema: Secondary | ICD-10-CM

## 2015-04-11 LAB — CBC
HCT: 26.7 % — ABNORMAL LOW (ref 39.0–52.0)
Hemoglobin: 8.3 g/dL — ABNORMAL LOW (ref 13.0–17.0)
MCH: 24.1 pg — ABNORMAL LOW (ref 26.0–34.0)
MCHC: 31.1 g/dL (ref 30.0–36.0)
MCV: 77.4 fL — ABNORMAL LOW (ref 78.0–100.0)
Platelets: 92 10*3/uL — ABNORMAL LOW (ref 150–400)
RBC: 3.45 MIL/uL — AB (ref 4.22–5.81)
RDW: 19.4 % — ABNORMAL HIGH (ref 11.5–15.5)
WBC: 4.6 10*3/uL (ref 4.0–10.5)

## 2015-04-11 LAB — BASIC METABOLIC PANEL
Anion gap: 8 (ref 5–15)
BUN: 20 mg/dL (ref 6–20)
CO2: 26 mmol/L (ref 22–32)
Calcium: 7.9 mg/dL — ABNORMAL LOW (ref 8.9–10.3)
Chloride: 100 mmol/L — ABNORMAL LOW (ref 101–111)
Creatinine, Ser: 1.06 mg/dL (ref 0.61–1.24)
GFR calc Af Amer: >60 mL/min (ref >60)
GFR calc non Af Amer: >60 mL/min (ref >60)
Glucose, Bld: 142 mg/dL — ABNORMAL HIGH (ref 65–99)
POTASSIUM: 4.7 mmol/L (ref 3.5–5.1)
Sodium: 134 mmol/L — ABNORMAL LOW (ref 135–145)

## 2015-04-11 MED ORDER — OXYCODONE HCL 10 MG PO TABS: 10.0000 mg | ORAL_TABLET | ORAL | Status: AC | PRN

## 2015-04-11 MED ORDER — DOXYCYCLINE HYCLATE 100 MG PO TABS: 100.0000 mg | ORAL_TABLET | Freq: Two times a day (BID) | ORAL | Status: DC

## 2015-04-11 MED ORDER — FUROSEMIDE 40 MG PO TABS: 40.0000 mg | ORAL_TABLET | Freq: Every day | ORAL | Status: AC

## 2015-04-11 MED ORDER — OXYCODONE HCL 5 MG PO TABS
10.0000 mg | ORAL_TABLET | ORAL | Status: DC | PRN
  Administered 2015-04-11 (×2): 10 mg via ORAL
  Filled 2015-04-11 (×2): qty 2

## 2015-04-11 MED ORDER — DOXYCYCLINE HYCLATE 100 MG PO TABS: 100.0000 mg | ORAL_TABLET | Freq: Two times a day (BID) | ORAL | Status: AC

## 2015-04-11 MED ORDER — PROPRANOLOL HCL ER 60 MG PO CP24: 60.0000 mg | ORAL_CAPSULE | Freq: Every day | ORAL | Status: AC

## 2015-04-11 MED ORDER — SPIRONOLACTONE 100 MG PO TABS: 100.0000 mg | ORAL_TABLET | Freq: Every day | ORAL | Status: AC

## 2015-04-11 MED ORDER — ADULT MULTIVITAMIN W/MINERALS CH: 1.0000 | ORAL_TABLET | Freq: Every day | ORAL | Status: AC

## 2015-04-11 NOTE — Progress Notes (Signed)
PT Cancellation Note  Patient Details Name: Isaac FolksGrover C Boen MRN: [id number] DOB: 07/06/1946   Cancelled Treatment:    Reason Eval/Treat Not Completed: Other (comment) (Refused due to left LE bleeding.  Pt states it will bleed if he gets up. Nursing aware)Thanks.   Tawni MillersWhite, Corgan Mormile F 04/11/2015, 12:10 PM [employer] CorporationDawn Emiline Mancebo,PT Acute Rehabilitation [phone])

## 2015-04-11 NOTE — Discharge Summary (Signed)
Physician Discharge Summary  Isaac Ashley ZOX:[id number] DOB: August 26, 1946 DOA: 04/05/2015  PCP: Avon Gully, MD  Admit date: 04/05/2015 Discharge date: 04/11/2015  Time spent: 65 minutes  Recommendations for Outpatient Follow-up:  1. Follow-up with FANTA,TESFAYE, MD in 1-2 weeks. On follow-up patient will need a comprehensive metabolic profile done to follow-up on electrolytes, renal function, LFTs. Patient might benefit from referral to outpatient gastroenterology for further management of his cirrhosis. 2. Patient is to follow-up with the wound care center for further management of his chronic lower extremity wounds and chronic lymphedema.  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 Pt was hospitalized at [facility] approx 2 weeks ago and it sounds like he has cirrhosis of the liver and chronic lymphedema to his legs which is severe. Comes in on day of admission,   because the swelling is worse, his scrotum is swollen along with his pannus now, but his legs are better. He denied heart failure. However, the patient and the sons really dont know much about what was going on and have a lot of questions about his [facility] stay. We do not have records from [facility] yet. Pt did know that he has been told he has cirrhosis, no etoh history. Has not been  evaluated by GI doctor. Denied fevers. Is always sob especially with exertion. Is very weak. His son is changing his leg dressings three times a week, and his sons say that his legs look much better than they did 2 weeks ago (of note, his legs still look horrible). He had some new redness to his left inner upper thigh which was new. He does not use oxygen at home. He says he takes a fluid pill at home. He does not know what his home meds are. We were asked to admit patient for anasarca, gross fluid overload and cellulitis of his left inner thigh. History obtained from patient and two sons.  Of note, after multiple times asking patient whether he was offered rehab or snf placement from [facility] (pt obviously would meet criteria for rehab) it finally came out that they did offer him this but he refused so he was sent home.   Hospital Course:  #1 decompensated cirrhosis with ascites Meld = 15. Prior history of significant alcohol use however no longer a heavy drinker. Patient with some clinical improvement. Hepatitis panel is negative. HIV is negative. Patient with poor understanding of his diet. Patient status post ultrasound-guided paracentesis on 04/07/2015 with 2.1 L of ascitic fluid removed. Patient was given albumin IV 1. Patient was on Aldactone and Lasix however secondary to mild increase in his creatinine diuretics were held on 04/08/2015 and resumed on 04/09/2015. Dr. Mahala Menghini had discussions with patient and his son who are considering skilled nursing facility. There was some concern of noncompliance.  Patient was maintained on Lasix 40 mg daily and Aldactone 100 mg daily with good diuresis. Patient was -2.1 L during the hospitalization. Patient with good oxygenation on room air. Patient was also started on propranolol. Patient will likely need referral to gastroenterologist as outpatient for further management of his cirrhosis. Patient will be discharged in stable and improved condition.    #2 cellulitis of the scrotum/legs and wounds Patient was seen by the wound care nurse and placed on Unna boots on. Patient was on IV vancomycin on admission secondary to cellulitis. IV Vancomycin was transitioned to oral doxycycline which she tolerated. Patient was discharged home on 10 more days of oral doxycycline to complete a two-week course of antibiotic coverage for purulent cellulitis. Patient is to follow-up with the wound care clinic as outpatient and for his chronic lymphedema management and physical therapy for wrapping of his lower extremities.  #3 acute renal insufficiency Felt to be secondary to diuretics initially. Patient was diuresed with IV diabetic since subsequently transitioned to oral Lasix. Patient was also placed on spironolactone. Patient's renal function improved and had resolved by day of discharge.  #4 atrial fibrillation Patient remained rate controlled throughout the hospitalization.  Patient deemed a poor candidate for Coumadin secondary to cirrhosis, thrombocytopenia.  #5 microcytic anemia/iron deficiency anemia Likely secondary to chronic liver disease. Hemoglobin remained stable throughout the hospitalization.  Stable.   Procedures:  CT abdomen and pelvis 04/06/2015  Chest x-ray 04/05/2015  Ultrasound-guided paracentesis with 2.1 L of fluid removed. Per Dr. Fredia SorrowYamagata 04/07/2015  Consultations:  Wound care nurse    Discharge Exam: Filed Vitals:   04/11/15 1336  BP: 102/58  Pulse: 81  Temp: 98.1 F (36.7 C)  Resp: 18    General: NAD Cardiovascular: RRR Respiratory: CTAB  Discharge Instructions   Discharge Instructions    Diet - low sodium heart healthy    Complete by:  As directed      Discharge instructions    Complete by:  As directed   Scrotal elevation. Follow up with FANTA,TESFAYE, MD in 1 week. Follow up with GI as outpatient.     Increase activity slowly    Complete by:  As directed           Current Discharge  Medication List    START taking these medications   Details  doxycycline (VIBRA-TABS) 100 MG tablet Take 1 tablet (100 mg total) by mouth every 12 (twelve) hours. Take for 10 days then stop. Qty: 20 tablet, Refills: 0    Multiple Vitamin (MULTIVITAMIN WITH MINERALS) TABS tablet Take 1 tablet by mouth daily with breakfast.    oxyCODONE 10 MG TABS Take 1 tablet (10 mg total) by mouth every 4 (four) hours as needed for moderate pain. Qty: 20 tablet, Refills: 0    propranolol ER (INDERAL LA) 60 MG 24 hr capsule Take 1 capsule (60 mg total) by mouth daily. Qty: 30 capsule, Refills: 0    spironolactone (ALDACTONE) 100 MG tablet Take 1 tablet (100 mg total) by mouth daily. Qty: 30 tablet, Refills: 0      CONTINUE these medications which have CHANGED   Details  furosemide (LASIX) 40 MG tablet Take 1 tablet (40 mg total) by mouth daily. Qty: 30 tablet, Refills: 0      CONTINUE these medications which have NOT CHANGED   Details  !! ALPRAZolam (XANAX) 0.5 MG tablet Take 0.5 mg by mouth 2 (two) times daily as needed for anxiety.    potassium chloride SA (K-DUR,KLOR-CON)  20 MEQ tablet Take 20 mEq by mouth daily.    silver sulfADIAZINE (SILVADENE) 1 % cream Apply 1 application topically daily. Apply topically to each leg with Kerlix dressing dressing and overlying Ace bandage for pressure effect.    !! ALPRAZolam (XANAX) 0.5 MG tablet Take 0.5 mg by mouth at bedtime. For sleep     !! - Potential duplicate medications found. Please discuss with provider.    STOP taking these medications     acetaminophen (TYLENOL) 500 MG tablet      oxyCODONE-acetaminophen (PERCOCET/ROXICET) 5-325 MG per tablet      sulfamethoxazole-trimethoprim (BACTRIM DS,SEPTRA DS) 800-160 MG per tablet        Allergies  Allergen Reactions  . Codeine Nausea Only  . Hydrocodone Nausea Only  . Penicillins Rash   Follow-up Information    Follow up with [facility], MD On 04/22/2015.   Specialty:  Internal  Medicine   Why:  @ 9:20 AM spoke    Contact information:   [address] [place] [address] [phone]        The results of significant diagnostics from this hospitalization (including imaging, microbiology, ancillary and laboratory) are listed below for reference.    Significant Diagnostic Studies: Dg Chest 2 View  04/05/2015   CLINICAL DATA:  Lower extremity and groin swelling, acute onset. Initial encounter.  EXAM: CHEST  2 VIEW  COMPARISON:  Chest radiograph performed 03/05/2015  FINDINGS: The lungs are well-aerated. Vascular congestion is noted, with mildly increased interstitial markings, concerning for mild interstitial edema. A small right pleural effusion is noted. No pneumothorax is seen.  The heart is enlarged.  No acute osseous abnormalities are seen.  IMPRESSION: Vascular congestion and cardiomegaly, with mildly increased interstitial markings, concerning for mild interstitial edema. Small right pleural effusion noted.   Electronically Signed   By: Roanna Raider M.D.   On: 04/05/2015 23:06   Ct Abdomen Pelvis W Contrast  04/06/2015   CLINICAL DATA:  Status post fall 3 months ago. Scrotal swelling and lower extremity swelling, chronic in nature. Initial encounter.  EXAM: CT ABDOMEN AND PELVIS WITH CONTRAST  TECHNIQUE: Multidetector CT imaging of the abdomen and pelvis was performed using the standard protocol following bolus administration of intravenous contrast.  CONTRAST:  OMNIPAQUE IOHEXOL 300 MG/ML  SOLN  COMPARISON:  CT of the abdomen and pelvis from 07/05/2014, and abdominal ultrasound performed 03/07/2015  FINDINGS: The visualized lung bases are clear. Scattered coronary artery calcifications are seen.  Moderate ascites is noted within the abdomen and pelvis, tracking into a small to moderate umbilical hernia. Diffuse soft tissue edema is noted along the lateral abdominal wall and pannus, likely reflecting mild anasarca.  Diffuse nodularity of the hepatic  contour is compatible with hepatic cirrhosis. The spleen is enlarged, measuring 20.6 cm in length. Stones are seen dependently within the gallbladder. The gallbladder is otherwise unremarkable. The pancreas and adrenal glands are unremarkable.  A 4 mm nonobstructing stone is noted at the interpole region of the right kidney. The kidneys are otherwise unremarkable. There is no evidence of hydronephrosis. No obstructing ureteral stones are seen. No significant perinephric stranding is appreciated.  No free fluid is identified. The small bowel is unremarkable in appearance. The stomach is within normal limits. No acute vascular abnormalities are seen. Mild calcification is seen along the abdominal aorta and its branches.  The appendix is normal in caliber and contains air, without evidence of appendicitis. Mild diverticulosis is noted along the sigmoid colon, without  evidence of diverticulitis.  The bladder is mildly distended and grossly unremarkable. The prostate is not well characterized. A small to moderate right inguinal hernia is seen, partially filled with fluid. There is diffuse soft tissue edema at the level of the scrotum. No underlying soft tissue air is seen. No inguinal lymphadenopathy is seen.  No acute osseous abnormalities are identified. Anterior flowing osteophytes are seen along the thoracic and lumbar spine, with fusion along the posterior elements, likely reflecting ankylosing spondylitis. There is also fusion at the sacroiliac joints.  IMPRESSION: 1. Diffuse scrotal wall edema noted. Small to moderate right inguinal hernia is partially filled with fluid. No evidence of herniation of bowel. 2. Moderate ascites within the abdomen and pelvis, tracking into a small to moderate umbilical hernia. 3. Diffuse soft tissue edema along the lateral abdominal wall and pannus likely reflects mild anasarca. 4. Findings of hepatic cirrhosis.  Splenomegaly noted. 5. Cholelithiasis; gallbladder otherwise  unremarkable. 6. Scattered coronary artery calcification noted. 7. Mild calcification along the abdominal aorta and its branches. 8. Mild diverticulosis along the sigmoid colon, without evidence of diverticulitis. 9. Changes of ankylosing spondylitis noted.   Electronically Signed   By: Roanna Raider M.D.   On: 04/06/2015 00:30   US Paracentesis  04/07/2015   INDICATION: Cirrhosis, ascites and request for therapeutic paracentesis.  EXAM: ULTRASOUND-GUIDED PARACENTESIS  COMPARISON:  CT Abd/Pelvis 04/06/15.  MEDICATIONS: None.  COMPLICATIONS: None immediate  TECHNIQUE: Informed written consent was obtained from the patient after a discussion of the risks, benefits and alternatives to treatment. A timeout was performed prior to the initiation of the procedure.  Initial ultrasound scanning demonstrates a small to moderate amount of ascites within the right upper abdominal quadrant. The right upper abdomen was prepped and draped in the usual sterile fashion. 1% lidocaine was used for local anesthesia.  Under direct ultrasound guidance, a 19 gauge, 10-cm, Yueh catheter was introduced. An ultrasound image was saved for documentation purposed. The paracentesis was performed. The catheter was removed and a dressing was applied. The patient tolerated the procedure well without immediate post procedural complication.  FINDINGS: A total of approximately 2.1 liters of serous fluid was removed.  IMPRESSION: Successful ultrasound-guided paracentesis yielding 2.1 liters of peritoneal fluid.  Read By:  Pattricia Boss PA-C   Electronically Signed   By: Irish Lack M.D.   On: 04/07/2015 12:09    Microbiology: Recent Results (from the past 240 hour(s))  Urine culture     Status: None   Collection Time: 04/06/15  6:38 AM  Result Value Ref Range Status   Specimen Description URINE, CLEAN CATCH  Final   Special Requests NONE  Final   Colony Count   Final    7,000 COLONIES/ML Performed at [facility]    Culture    Final    INSIGNIFICANT GROWTH Performed at [facility]    Report Status 04/07/2015 FINAL  Final     Labs: Basic Metabolic Panel:  Recent Labs Lab 04/07/15 0548 04/07/15 1230 04/08/15 0521 04/09/15 0431 04/10/15 0601 04/11/15 0613  NA 134*  --  134* 135 135 134*  K 3.8  --  4.0 4.5 4.5 4.7  CL 100*  --  101 102 101 100*  CO2 30  --  28 25 26 26   GLUCOSE 135*  --  117* 114* 104* 142*  BUN 26*  --  28* 27* 23* 20  CREATININE 1.07  --  1.13 1.01 1.09 1.06  CALCIUM 7.8*  --  7.8* 7.9* 8.1* 7.9*  MG  --  1.9  --   --   --   --    Liver Function Tests:  Recent Labs Lab 04/05/15 2224 04/07/15 0548 04/08/15 0521 04/09/15 0431 04/10/15 0601  AST 26 28 25 24 25   ALT 14* 14* 13* 13* 12*  ALKPHOS 96 75 67 72 72  BILITOT 1.1 0.8 0.9 1.0 0.9  PROT 7.0 6.5 6.8 6.7 6.8  ALBUMIN 2.0* 1.9* 2.4* 2.4* 2.3*   No results for input(s): LIPASE, AMYLASE in the last 168 hours. No results for input(s): AMMONIA in the last 168 hours. CBC:  Recent Labs Lab 04/05/15 2224  04/07/15 0548 04/08/15 0521 04/09/15 0431 04/10/15 0601 04/11/15 0613  WBC 5.2  < > 4.0 4.4 4.7 4.1 4.6  NEUTROABS 4.6  --  2.4 2.8  --  2.5  --   HGB 8.5*  < > 8.9* 8.3* 8.4* 8.6* 8.3*  HCT 28.2*  < > 29.3* 26.8* 27.5* 27.2* 26.7*  MCV 77.9*  < > 79.6 77.2* 77.2* 76.8* 77.4*  PLT 114*  < > 128* 105* 113* 89* 92*  < > = values in this interval not displayed. Cardiac Enzymes: No results for input(s): CKTOTAL, CKMB, CKMBINDEX, TROPONINI in the last 168 hours. BNP: BNP (last 3 results)  Recent Labs  04/05/15 0224  BNP 189.9*    ProBNP (last 3 results) No results for input(s): PROBNP in the last 8760 hours.  CBG: No results for input(s): GLUCAP in the last 168 hours.     SignedRamiro Harvest MD [facility] 04/11/2015, 4:03 PM

## 2015-04-11 NOTE — Progress Notes (Signed)
TCT patient's daughter with patient's permission about DCP; TCT Elnita MaxwellCheryl [phone]). Elnita MaxwellCheryl [facility] the [facility], also CM discussed DC planning for the wound [facility] clinic - [facility] [phone]( [phone]) they will not be able to see the patient until the end of June. Also the wound care clinic in [place] called and was informed of the same - unable to see the patient until the end of June. Patient lives with spouse and is undecided about going to SNF. CM informed Elnita MaxwellCheryl that [facility] but they will only provide care for a limited time, not 24hr. Elnita MaxwellCheryl is to talk to her mother Liborio NixonJanice and the patient about their options for discharge; B Shelba FlakeChandler RN,BSn,MHA [phone]

## 2015-04-11 NOTE — Progress Notes (Signed)
Talked to patient again about DCP; patient wants to talk to friends about SNF next week; Patient stated that he has [facility] in the past and request to use them again. Attending MD if patient decides to go home at discharge, please order HHRN/ PT/ OT/ aide/ SW; a [facility] will be place [facility] for cellulitis/ lymphoedema; Alexis GoodellB Seda Kronberg RN,BSN,MHA [phone]

## 2015-04-11 NOTE — Progress Notes (Signed)
Discharged patient per MD orders. IV and telemetry removed. Reviewed home medications and discharge instructions with patient. Informed patient that home health has been set up and will start tomorrow. Prescriptions given to pt. Case manager has arranged for ambulance to pick patient up and transport home.

## 2015-04-11 NOTE — Progress Notes (Signed)
[facility];  Clinical information faxed as requested; Alexis GoodellB Zaim Nitta RN,BSN,MHA [phone]

## 2015-04-11 NOTE — Progress Notes (Signed)
[facility]  Pt/RN informed.

## 2015-11-26 IMAGING — US US PARACENTESIS
1 series · 10 of 10 positions shown · non-contrast
Comparison: CT Abd/Pelvis 04/06/15.

MEDICATIONS:
None.

COMPLICATIONS:
None immediate

INDICATION: Cirrhosis, ascites and request for therapeutic paracentesis.

EXAM:
ULTRASOUND-GUIDED PARACENTESIS
TECHNIQUE: Informed written consent was obtained from the patient after a
discussion of the risks, benefits and alternatives to treatment. A
timeout was performed prior to the initiation of the procedure.

[Series 1: us paracentesis · 0.31mm/px · 10 of 10 slices shown]
[im 1/10]
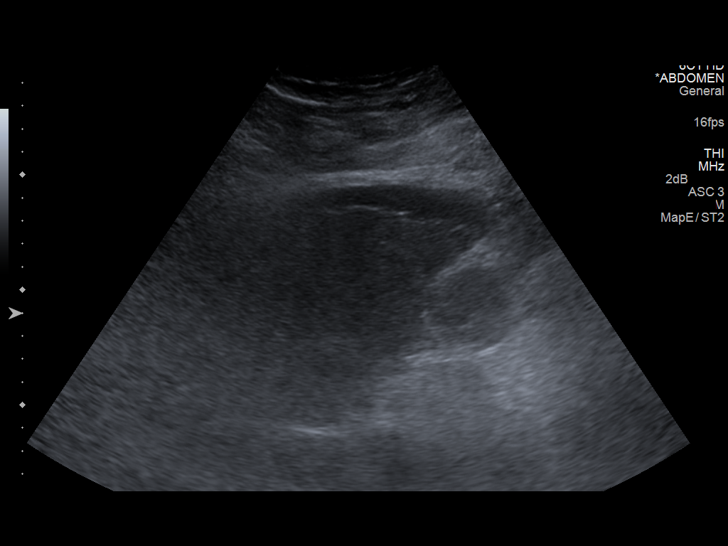
[im 2/10]
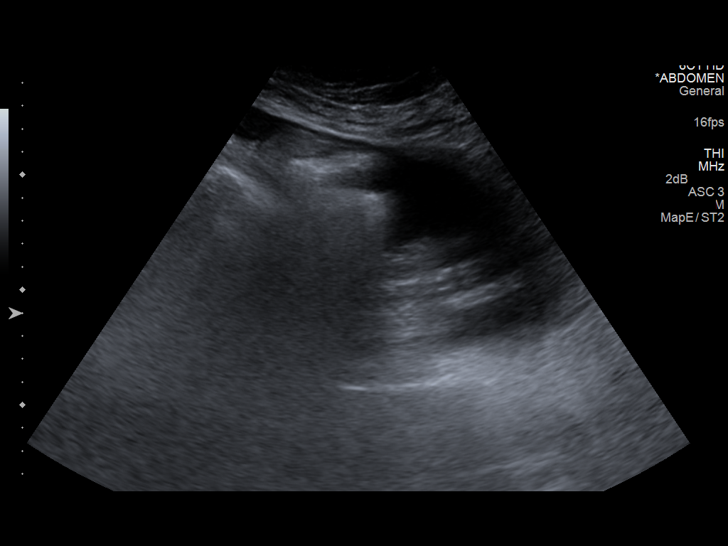
[im 3/10]
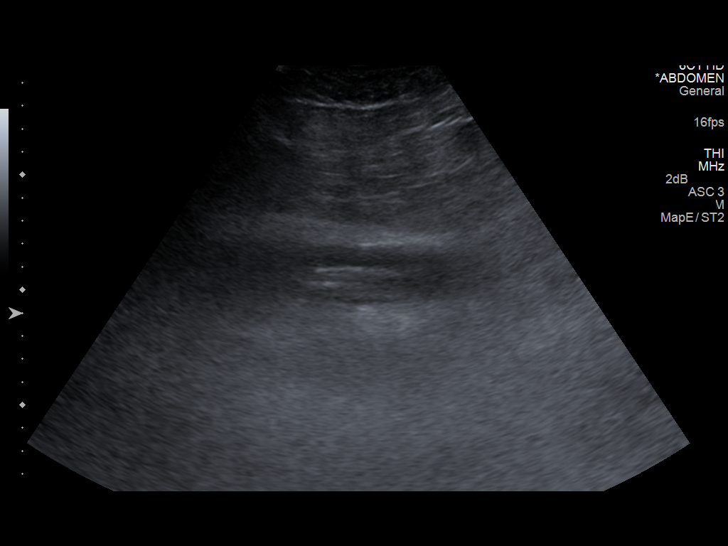
[im 4/10]
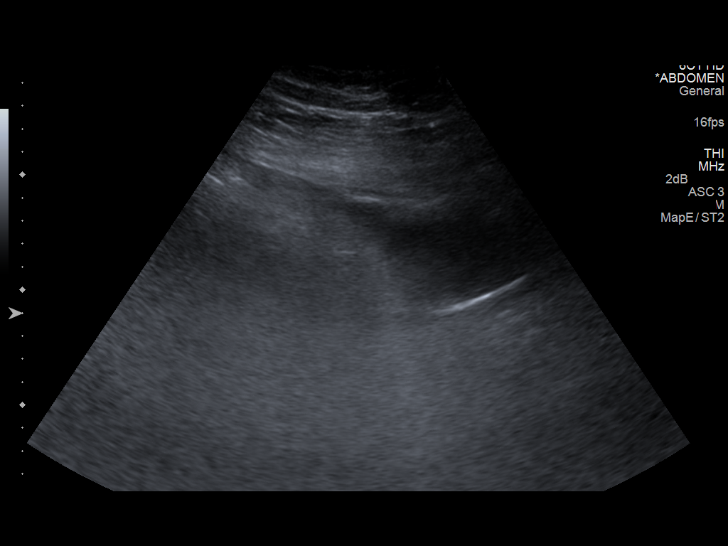
[im 5/10]
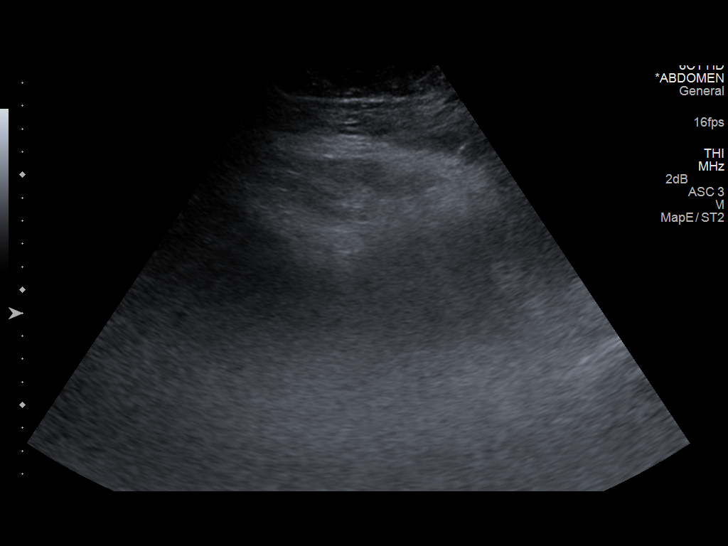
[im 6/10]
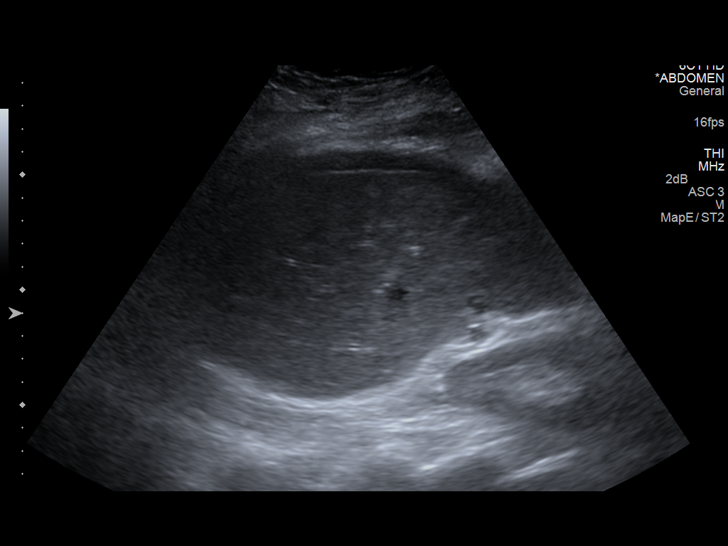
[im 7/10]
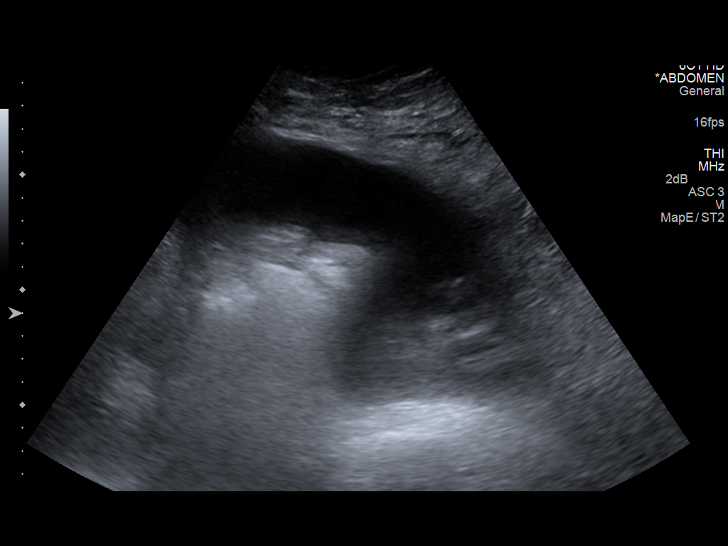
[im 8/10]
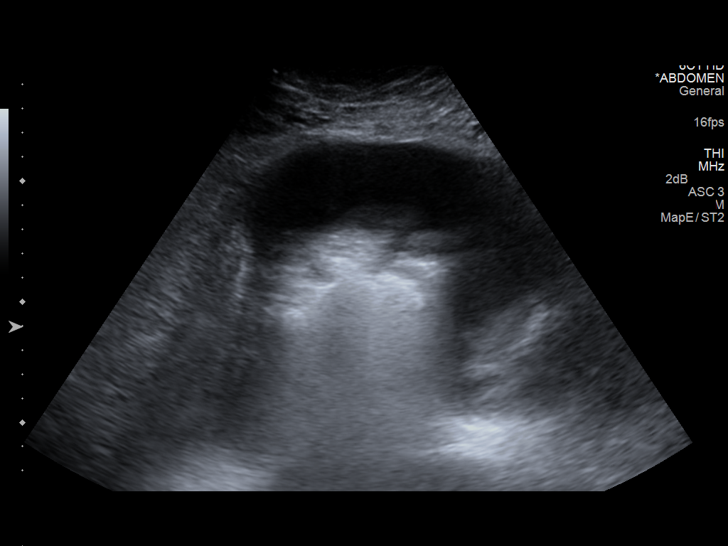
[im 9/10]
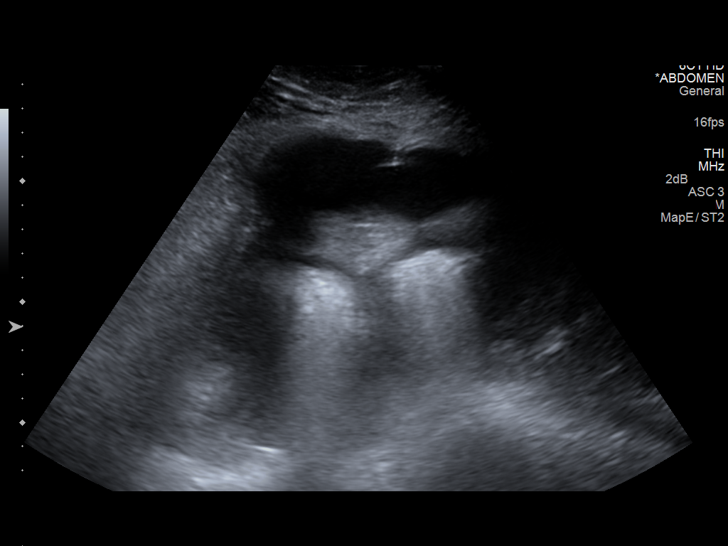
[im 10/10]
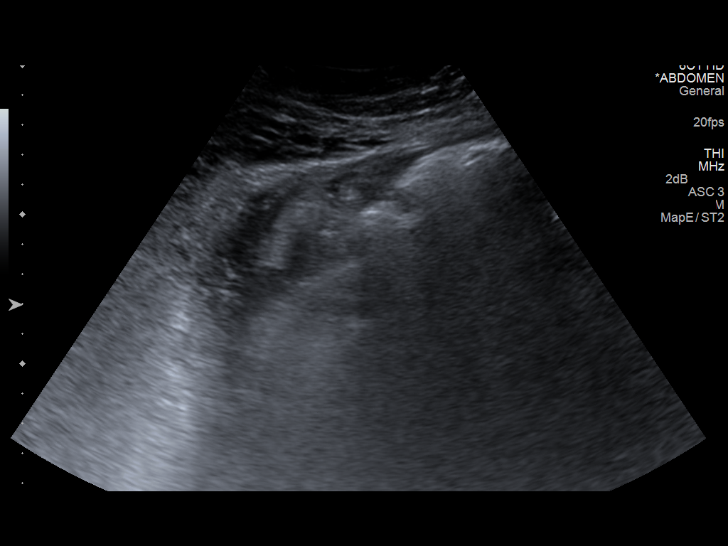

[10 of 10 positions shown; findings below may reference images not displayed]

Initial ultrasound scanning demonstrates a small to moderate amount
of ascites within the right upper abdominal quadrant. The right
upper abdomen was prepped and draped in the usual sterile fashion.
1% lidocaine was used for local anesthesia.

Under direct ultrasound guidance, a 19 gauge, 10-cm, Yueh catheter
was introduced. An ultrasound image was saved for documentation
purposed. The paracentesis was performed. The catheter was removed
and a dressing was applied. The patient tolerated the procedure well
without immediate post procedural complication.
FINDINGS: A total of approximately 2.1 liters of serous fluid was removed.
IMPRESSION: Successful ultrasound-guided paracentesis yielding 2.1 liters of
peritoneal fluid.

## 2016-04-09 DEATH — deceased

## 2016-05-20 IMAGING — CT CT ABD-PELV W/ CM
2 of 6 series · 15 of 46 positions shown, 17 images · IV contrast (Omni 300)
Comparison: CT of the abdomen and pelvis from 07/05/2014, and
abdominal ultrasound performed 03/07/2015

CLINICAL DATA: Status post fall 3 months ago. Scrotal swelling and
lower extremity swelling, chronic in nature. Initial encounter.

EXAM:
CT ABDOMEN AND PELVIS WITH CONTRAST
TECHNIQUE: Multidetector CT imaging of the abdomen and pelvis was performed
using the standard protocol following bolus administration of
intravenous contrast.
CONTRAST:  100mL OMNIPAQUE IOHEXOL 300 MG/ML  SOLN

[Series 3: abd/ pelvis 5.0 i30f 1 · axial · 0.96mm/px · z∈[+1025,+1520]mm · 12 of 113 slices shown, 14 images]
[im 7/113  soft-tissue]
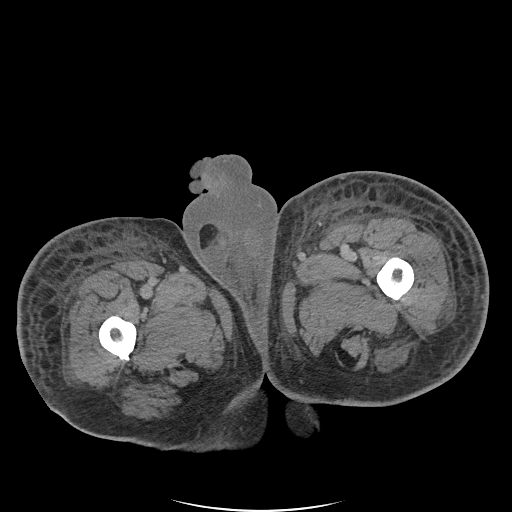
[im 7/113  bone]
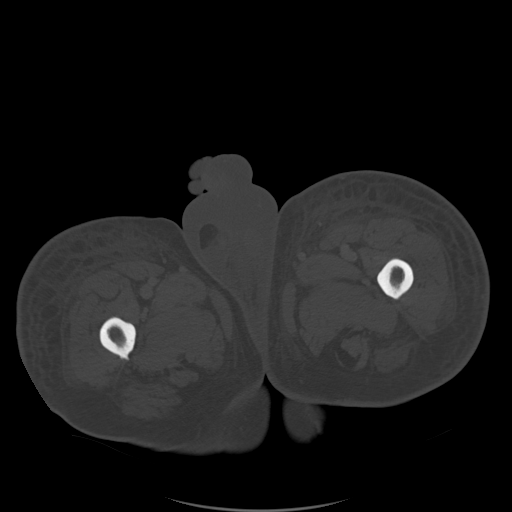
[im 19/113  soft-tissue]
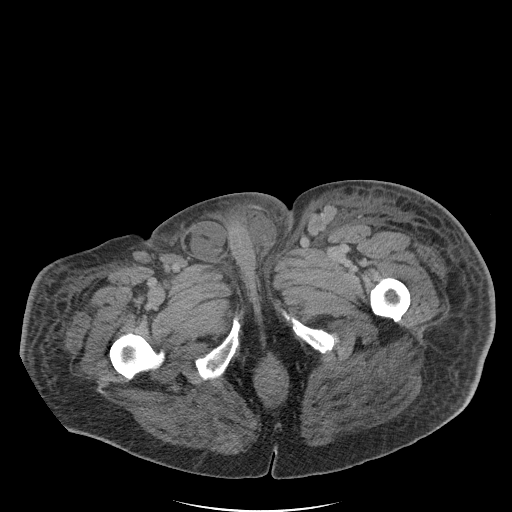
[im 25/113  soft-tissue]
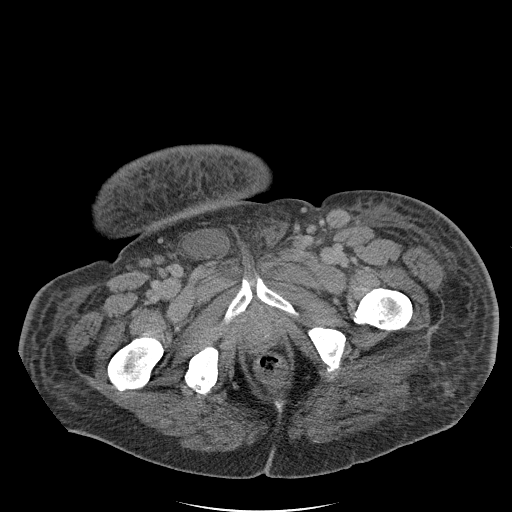
[im 32/113  soft-tissue]
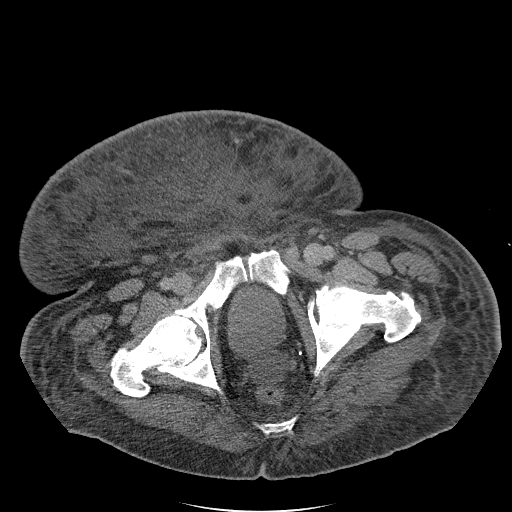
[im 44/113  soft-tissue]
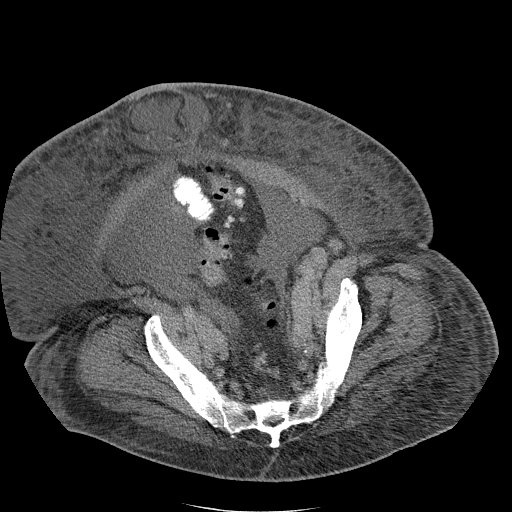
[im 50/113  soft-tissue]
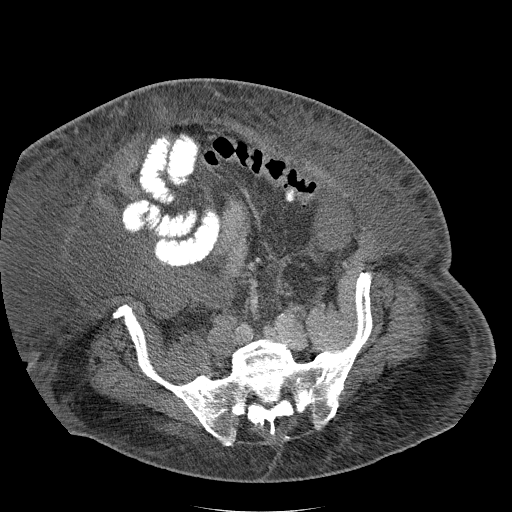
[im 63/113  soft-tissue]
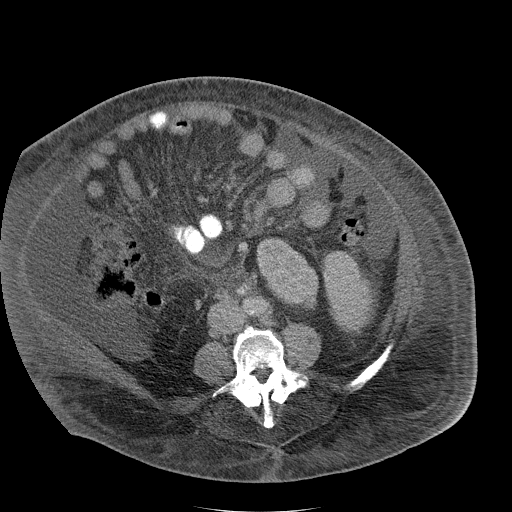
[im 69/113  soft-tissue]
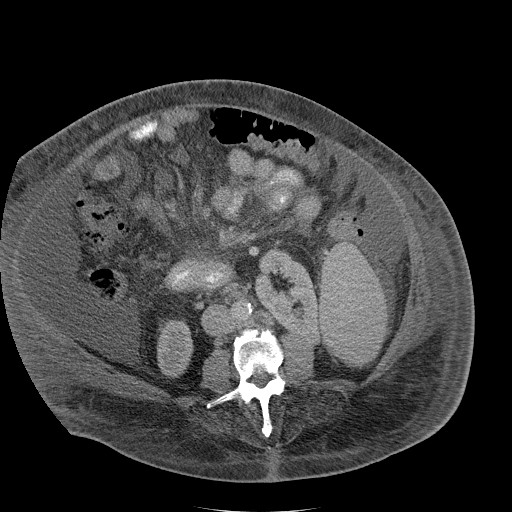
[im 81/113  soft-tissue]
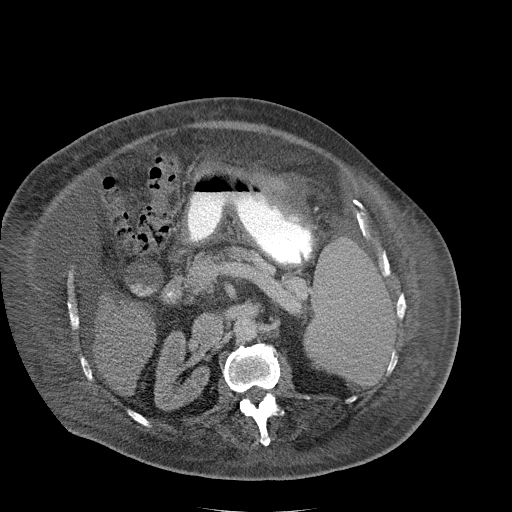
[im 81/113  bone]
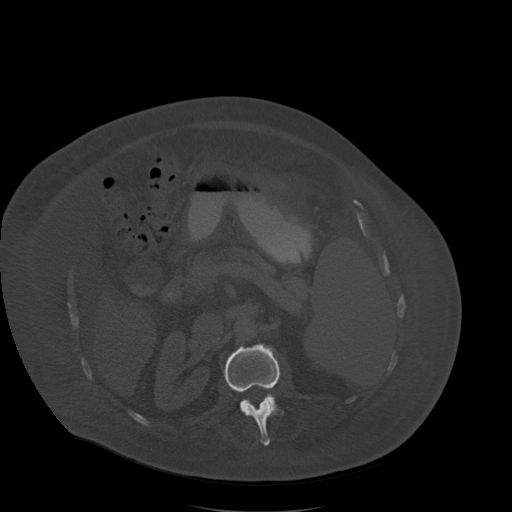
[im 88/113  soft-tissue]
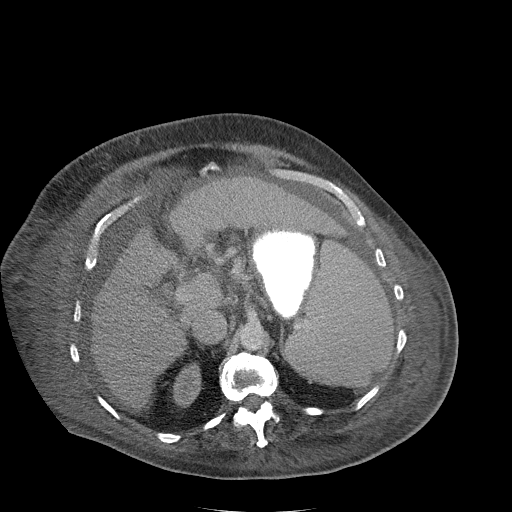
[im 94/113  soft-tissue]
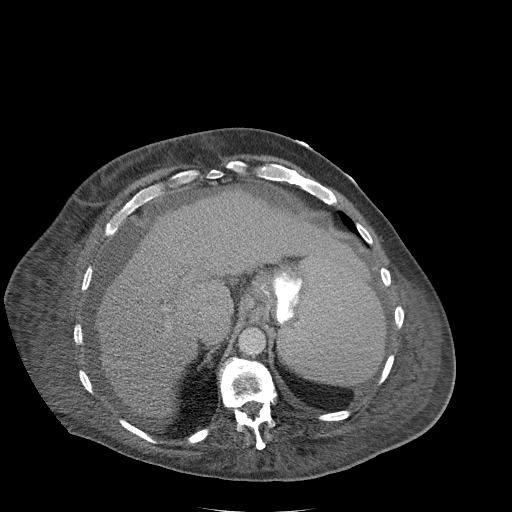
[im 106/113  soft-tissue]
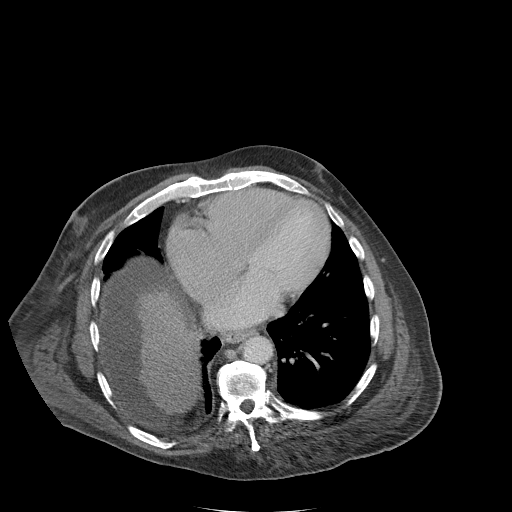

[Series 6: coronals · coronal · 0.81mm/px · 3 of 198 slices shown]
[im 66/198  soft-tissue]
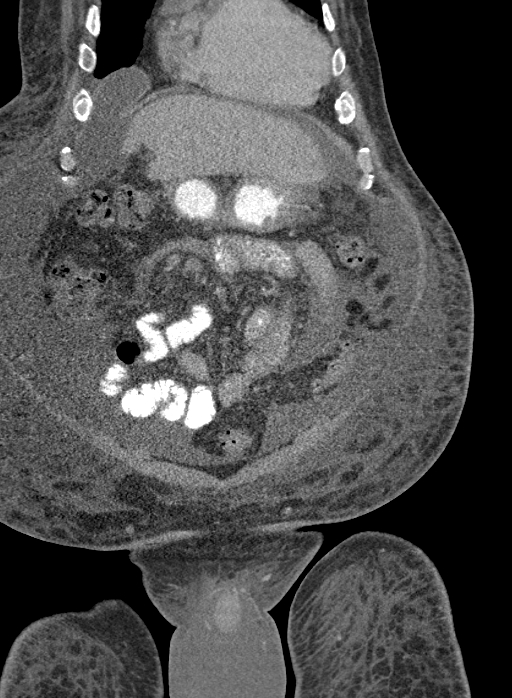
[im 88/198  soft-tissue]
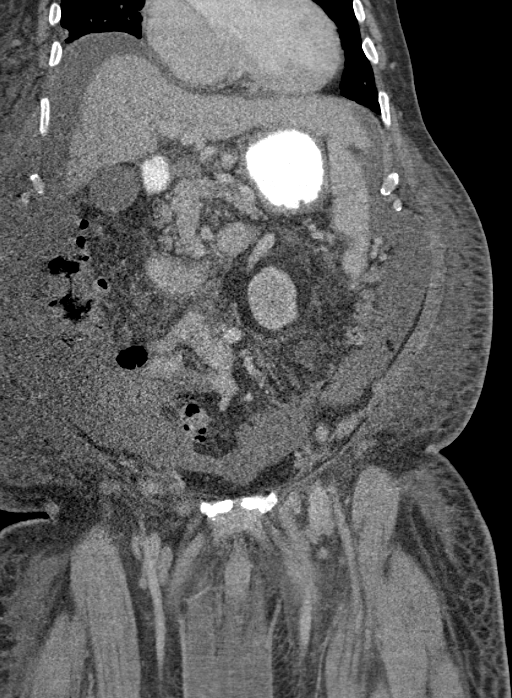
[im 110/198  soft-tissue]
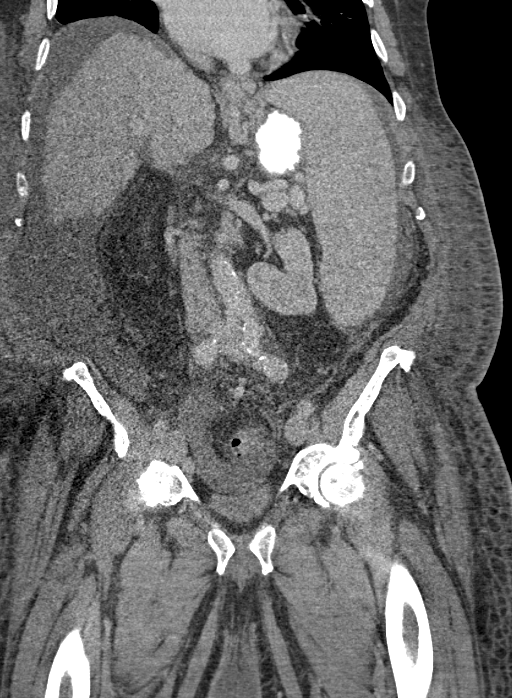

[15 of 46 positions shown; findings below may reference images not displayed]

FINDINGS: The visualized lung bases are clear. Scattered coronary artery
calcifications are seen.

Moderate ascites is noted within the abdomen and pelvis, tracking
into a small to moderate umbilical hernia. Diffuse soft tissue edema
is noted along the lateral abdominal wall and pannus, likely
reflecting mild anasarca.

Diffuse nodularity of the hepatic contour is compatible with hepatic
cirrhosis. The spleen is enlarged, measuring 20.6 cm in length.
Stones are seen dependently within the gallbladder. The gallbladder
is otherwise unremarkable. The pancreas and adrenal glands are
unremarkable.

A 4 mm nonobstructing stone is noted at the interpole region of the
right kidney. The kidneys are otherwise unremarkable. There is no
evidence of hydronephrosis. No obstructing ureteral stones are seen.
No significant perinephric stranding is appreciated.

No free fluid is identified. The small bowel is unremarkable in
appearance. The stomach is within normal limits. No acute vascular
abnormalities are seen. Mild calcification is seen along the
abdominal aorta and its branches.

The appendix is normal in caliber and contains air, without evidence
of appendicitis. Mild diverticulosis is noted along the sigmoid
colon, without evidence of diverticulitis.

The bladder is mildly distended and grossly unremarkable. The
prostate is not well characterized. A small to moderate right
inguinal hernia is seen, partially filled with fluid. There is
diffuse soft tissue edema at the level of the scrotum. No underlying
soft tissue air is seen. No inguinal lymphadenopathy is seen.

No acute osseous abnormalities are identified. Anterior flowing
osteophytes are seen along the thoracic and lumbar spine, with
fusion along the posterior elements, likely reflecting ankylosing
spondylitis. There is also fusion at the sacroiliac joints.
IMPRESSION: 1. Diffuse scrotal wall edema noted. Small to moderate right
inguinal hernia is partially filled with fluid. No evidence of
herniation of bowel.
2. Moderate ascites within the abdomen and pelvis, tracking into a
small to moderate umbilical hernia.
3. Diffuse soft tissue edema along the lateral abdominal wall and
pannus likely reflects mild anasarca.
4. Findings of hepatic cirrhosis.  Splenomegaly noted.
5. Cholelithiasis; gallbladder otherwise unremarkable.
6. Scattered coronary artery calcification noted.
7. Mild calcification along the abdominal aorta and its branches.
8. Mild diverticulosis along the sigmoid colon, without evidence of
diverticulitis.
9. Changes of ankylosing spondylitis noted.
